# Patient Record
Sex: Female | Born: 1975 | Race: White | Hispanic: Yes | Smoking: Never smoker
Health system: Southern US, Community
[De-identification: ages and names within clinical notes are randomized; demographics above are authoritative.]

---

## 2020-03-06 ENCOUNTER — Other Ambulatory Visit: Payer: Self-pay | Admitting: Endocrinology

## 2020-03-06 DIAGNOSIS — Z1231 Encounter for screening mammogram for malignant neoplasm of breast: Secondary | ICD-10-CM

## 2022-02-11 ENCOUNTER — Other Ambulatory Visit: Payer: Self-pay | Admitting: Endocrinology

## 2022-02-11 DIAGNOSIS — Z1231 Encounter for screening mammogram for malignant neoplasm of breast: Secondary | ICD-10-CM

## 2022-02-18 ENCOUNTER — Ambulatory Visit: Payer: Medicaid Other

## 2022-02-18 ENCOUNTER — Ambulatory Visit
Admission: RE | Admit: 2022-02-18 | Discharge: 2022-02-18 | Disposition: A | Discharge status: Home / Self Care | Payer: BC Managed Care – PPO | Source: Ambulatory Visit | Attending: Endocrinology | Admitting: Endocrinology

## 2022-02-18 DIAGNOSIS — Z1231 Encounter for screening mammogram for malignant neoplasm of breast: Secondary | ICD-10-CM

## 2022-02-19 ENCOUNTER — Other Ambulatory Visit: Payer: Self-pay | Admitting: Endocrinology

## 2022-02-19 DIAGNOSIS — R928 Other abnormal and inconclusive findings on diagnostic imaging of breast: Secondary | ICD-10-CM

## 2022-03-12 ENCOUNTER — Ambulatory Visit
Admission: RE | Admit: 2022-03-12 | Discharge: 2022-03-12 | Disposition: A | Discharge status: Home / Self Care | Payer: BC Managed Care – PPO | Source: Ambulatory Visit | Attending: Endocrinology | Admitting: Endocrinology

## 2022-03-12 ENCOUNTER — Other Ambulatory Visit: Payer: Self-pay

## 2022-03-12 ENCOUNTER — Other Ambulatory Visit: Payer: Self-pay | Admitting: Endocrinology

## 2022-03-12 DIAGNOSIS — R928 Other abnormal and inconclusive findings on diagnostic imaging of breast: Secondary | ICD-10-CM

## 2022-03-12 DIAGNOSIS — N6320 Unspecified lump in the left breast, unspecified quadrant: Secondary | ICD-10-CM

## 2022-03-12 DIAGNOSIS — R921 Mammographic calcification found on diagnostic imaging of breast: Secondary | ICD-10-CM

## 2022-03-27 ENCOUNTER — Ambulatory Visit
Admission: RE | Admit: 2022-03-27 | Discharge: 2022-03-27 | Disposition: A | Discharge status: Home / Self Care | Payer: BC Managed Care – PPO | Source: Ambulatory Visit | Attending: Endocrinology | Admitting: Endocrinology

## 2022-03-27 DIAGNOSIS — R921 Mammographic calcification found on diagnostic imaging of breast: Secondary | ICD-10-CM

## 2022-03-27 DIAGNOSIS — N6320 Unspecified lump in the left breast, unspecified quadrant: Secondary | ICD-10-CM

## 2022-03-27 HISTORY — PX: BREAST BIOPSY: SHX20

## 2022-04-18 ENCOUNTER — Ambulatory Visit: Payer: Self-pay | Admitting: General Surgery

## 2022-04-18 DIAGNOSIS — D242 Benign neoplasm of left breast: Secondary | ICD-10-CM

## 2022-04-22 ENCOUNTER — Other Ambulatory Visit: Payer: Self-pay | Admitting: General Surgery

## 2022-04-22 DIAGNOSIS — D242 Benign neoplasm of left breast: Secondary | ICD-10-CM

## 2022-04-30 ENCOUNTER — Other Ambulatory Visit: Payer: Self-pay

## 2022-04-30 ENCOUNTER — Encounter (HOSPITAL_BASED_OUTPATIENT_CLINIC_OR_DEPARTMENT_OTHER): Payer: Self-pay | Admitting: General Surgery

## 2022-05-07 ENCOUNTER — Ambulatory Visit
Admission: RE | Admit: 2022-05-07 | Discharge: 2022-05-07 | Disposition: A | Discharge status: Home / Self Care | Payer: BC Managed Care – PPO | Source: Ambulatory Visit | Attending: General Surgery | Admitting: General Surgery

## 2022-05-07 DIAGNOSIS — D242 Benign neoplasm of left breast: Secondary | ICD-10-CM

## 2022-05-09 ENCOUNTER — Ambulatory Visit
Admission: RE | Admit: 2022-05-09 | Discharge: 2022-05-09 | Disposition: A | Discharge status: Home / Self Care | Payer: BC Managed Care – PPO | Source: Ambulatory Visit | Attending: General Surgery | Admitting: General Surgery

## 2022-05-09 ENCOUNTER — Encounter (HOSPITAL_BASED_OUTPATIENT_CLINIC_OR_DEPARTMENT_OTHER): Payer: Self-pay | Admitting: General Surgery

## 2022-05-09 ENCOUNTER — Ambulatory Visit (HOSPITAL_BASED_OUTPATIENT_CLINIC_OR_DEPARTMENT_OTHER)
Admission: RE | Admit: 2022-05-09 | Discharge: 2022-05-09 | Disposition: A | Discharge status: Home / Self Care | Payer: BC Managed Care – PPO | Attending: General Surgery | Admitting: General Surgery

## 2022-05-09 ENCOUNTER — Encounter (HOSPITAL_BASED_OUTPATIENT_CLINIC_OR_DEPARTMENT_OTHER)
Admission: RE | Disposition: A | Discharge status: Home / Self Care | Payer: Self-pay | Source: Home / Self Care | Attending: General Surgery

## 2022-05-09 ENCOUNTER — Ambulatory Visit (HOSPITAL_BASED_OUTPATIENT_CLINIC_OR_DEPARTMENT_OTHER): Payer: BC Managed Care – PPO | Admitting: Certified Registered"

## 2022-05-09 ENCOUNTER — Other Ambulatory Visit: Payer: Self-pay

## 2022-05-09 DIAGNOSIS — N6082 Other benign mammary dysplasias of left breast: Secondary | ICD-10-CM | POA: Diagnosis not present

## 2022-05-09 DIAGNOSIS — D242 Benign neoplasm of left breast: Secondary | ICD-10-CM

## 2022-05-09 HISTORY — PX: BREAST LUMPECTOMY WITH RADIOACTIVE SEED LOCALIZATION: SHX6424

## 2022-05-09 LAB — POCT PREGNANCY, URINE: Preg Test, Ur: NEGATIVE

## 2022-05-09 SURGERY — BREAST LUMPECTOMY WITH RADIOACTIVE SEED LOCALIZATION
Anesthesia: General | Site: Breast | Laterality: Left

## 2022-05-09 MED ORDER — CELECOXIB 200 MG PO CAPS
200.0000 mg | ORAL_CAPSULE | ORAL | Status: AC
  Administered 2022-05-09: 200 mg via ORAL

## 2022-05-09 MED ORDER — ONDANSETRON HCL 4 MG/2ML IJ SOLN
INTRAMUSCULAR | Status: AC
  Filled 2022-05-09: qty 2

## 2022-05-09 MED ORDER — LIDOCAINE HCL (CARDIAC) PF 100 MG/5ML IV SOSY
PREFILLED_SYRINGE | INTRAVENOUS | Status: DC | PRN
  Administered 2022-05-09: 100 mg via INTRAVENOUS

## 2022-05-09 MED ORDER — CEFAZOLIN SODIUM-DEXTROSE 2-4 GM/100ML-% IV SOLN
INTRAVENOUS | Status: AC
  Filled 2022-05-09: qty 100

## 2022-05-09 MED ORDER — GABAPENTIN 300 MG PO CAPS
300.0000 mg | ORAL_CAPSULE | ORAL | Status: AC
  Administered 2022-05-09: 300 mg via ORAL

## 2022-05-09 MED ORDER — ACETAMINOPHEN 500 MG PO TABS
ORAL_TABLET | ORAL | Status: AC
  Filled 2022-05-09: qty 2

## 2022-05-09 MED ORDER — ONDANSETRON HCL 4 MG/2ML IJ SOLN: 4.0000 mg | Freq: Once | INTRAMUSCULAR | Status: DC | PRN

## 2022-05-09 MED ORDER — OXYCODONE HCL 5 MG PO TABS: 5.0000 mg | ORAL_TABLET | Freq: Once | ORAL | Status: DC | PRN

## 2022-05-09 MED ORDER — KETOROLAC TROMETHAMINE 30 MG/ML IJ SOLN: 30.0000 mg | Freq: Once | INTRAMUSCULAR | Status: DC | PRN

## 2022-05-09 MED ORDER — MIDAZOLAM HCL 2 MG/2ML IJ SOLN
INTRAMUSCULAR | Status: AC
  Filled 2022-05-09: qty 2

## 2022-05-09 MED ORDER — DEXAMETHASONE SODIUM PHOSPHATE 10 MG/ML IJ SOLN
INTRAMUSCULAR | Status: AC
  Filled 2022-05-09: qty 1

## 2022-05-09 MED ORDER — CEFAZOLIN SODIUM-DEXTROSE 2-4 GM/100ML-% IV SOLN
2.0000 g | INTRAVENOUS | Status: AC
  Administered 2022-05-09: 2 g via INTRAVENOUS

## 2022-05-09 MED ORDER — OXYCODONE HCL 5 MG/5ML PO SOLN: 5.0000 mg | Freq: Once | ORAL | Status: DC | PRN

## 2022-05-09 MED ORDER — FENTANYL CITRATE (PF) 100 MCG/2ML IJ SOLN: 25.0000 ug | INTRAMUSCULAR | Status: DC | PRN

## 2022-05-09 MED ORDER — FENTANYL CITRATE (PF) 100 MCG/2ML IJ SOLN
INTRAMUSCULAR | Status: AC
Start: 2022-05-09 — End: ?
  Filled 2022-05-09: qty 2

## 2022-05-09 MED ORDER — DROPERIDOL 2.5 MG/ML IJ SOLN
INTRAMUSCULAR | Status: DC | PRN
  Administered 2022-05-09: .625 mg via INTRAVENOUS

## 2022-05-09 MED ORDER — FENTANYL CITRATE (PF) 100 MCG/2ML IJ SOLN
INTRAMUSCULAR | Status: DC | PRN
Start: 2022-05-09 — End: 2022-05-09
  Administered 2022-05-09 (×2): 25 ug via INTRAVENOUS

## 2022-05-09 MED ORDER — PROPOFOL 10 MG/ML IV BOLUS
INTRAVENOUS | Status: DC | PRN
  Administered 2022-05-09: 200 mg via INTRAVENOUS

## 2022-05-09 MED ORDER — CHLORHEXIDINE GLUCONATE CLOTH 2 % EX PADS: 6.0000 | MEDICATED_PAD | Freq: Once | CUTANEOUS | Status: DC

## 2022-05-09 MED ORDER — DEXAMETHASONE SODIUM PHOSPHATE 10 MG/ML IJ SOLN
INTRAMUSCULAR | Status: DC | PRN
  Administered 2022-05-09: 10 mg via INTRAVENOUS

## 2022-05-09 MED ORDER — BUPIVACAINE-EPINEPHRINE (PF) 0.25% -1:200000 IJ SOLN
INTRAMUSCULAR | Status: DC | PRN
  Administered 2022-05-09: 16 mL via PERINEURAL

## 2022-05-09 MED ORDER — CELECOXIB 200 MG PO CAPS
ORAL_CAPSULE | ORAL | Status: AC
  Filled 2022-05-09: qty 1

## 2022-05-09 MED ORDER — MIDAZOLAM HCL 5 MG/5ML IJ SOLN
INTRAMUSCULAR | Status: DC | PRN
  Administered 2022-05-09: 2 mg via INTRAVENOUS

## 2022-05-09 MED ORDER — PROPOFOL 10 MG/ML IV BOLUS
INTRAVENOUS | Status: AC
  Filled 2022-05-09: qty 20

## 2022-05-09 MED ORDER — ACETAMINOPHEN 500 MG PO TABS
1000.0000 mg | ORAL_TABLET | ORAL | Status: AC
  Administered 2022-05-09: 1000 mg via ORAL

## 2022-05-09 MED ORDER — OXYCODONE HCL 5 MG PO TABS: 5.0000 mg | ORAL_TABLET | Freq: Four times a day (QID) | ORAL | 0 refills | Status: AC | PRN

## 2022-05-09 MED ORDER — LIDOCAINE 2% (20 MG/ML) 5 ML SYRINGE
INTRAMUSCULAR | Status: AC
  Filled 2022-05-09: qty 5

## 2022-05-09 MED ORDER — LACTATED RINGERS IV SOLN: INTRAVENOUS | Status: DC

## 2022-05-09 MED ORDER — GABAPENTIN 300 MG PO CAPS
ORAL_CAPSULE | ORAL | Status: AC
  Filled 2022-05-09: qty 1

## 2022-05-09 MED ORDER — ONDANSETRON HCL 4 MG/2ML IJ SOLN
INTRAMUSCULAR | Status: DC | PRN
  Administered 2022-05-09: 4 mg via INTRAVENOUS

## 2022-05-09 SURGICAL SUPPLY — 45 items
APPLIER CLIP 9.375 MED OPEN (MISCELLANEOUS)
BLADE SURG 15 STRL LF DISP TIS (BLADE) ×1 IMPLANT
BLADE SURG 15 STRL SS (BLADE) ×1
CANISTER SUC SOCK COL 7IN (MISCELLANEOUS) ×2 IMPLANT
CANISTER SUCT 1200ML W/VALVE (MISCELLANEOUS) ×2 IMPLANT
CHLORAPREP W/TINT 26 (MISCELLANEOUS) ×2 IMPLANT
CLIP APPLIE 9.375 MED OPEN (MISCELLANEOUS) IMPLANT
COVER BACK TABLE 60X90IN (DRAPES) ×2 IMPLANT
COVER MAYO STAND STRL (DRAPES) ×2 IMPLANT
COVER PROBE W GEL 5X96 (DRAPES) ×2 IMPLANT
DERMABOND ADVANCED (GAUZE/BANDAGES/DRESSINGS) ×1
DERMABOND ADVANCED .7 DNX12 (GAUZE/BANDAGES/DRESSINGS) ×1 IMPLANT
DRAPE LAPAROSCOPIC ABDOMINAL (DRAPES) ×2 IMPLANT
DRAPE UTILITY XL STRL (DRAPES) ×2 IMPLANT
ELECT COATED BLADE 2.86 ST (ELECTRODE) ×2 IMPLANT
ELECT REM PT RETURN 9FT ADLT (ELECTROSURGICAL) ×2
ELECTRODE REM PT RTRN 9FT ADLT (ELECTROSURGICAL) ×1 IMPLANT
GLOVE BIO SURGEON STRL SZ7.5 (GLOVE) ×5 IMPLANT
GLOVE BIOGEL PI IND STRL 7.5 (GLOVE) IMPLANT
GLOVE BIOGEL PI IND STRL 8 (GLOVE) IMPLANT
GLOVE BIOGEL PI INDICATOR 7.5 (GLOVE) ×1
GLOVE BIOGEL PI INDICATOR 8 (GLOVE) ×1
GLOVE ECLIPSE 6.5 STRL STRAW (GLOVE) ×1 IMPLANT
GOWN STRL REUS W/ TWL LRG LVL3 (GOWN DISPOSABLE) ×2 IMPLANT
GOWN STRL REUS W/TWL LRG LVL3 (GOWN DISPOSABLE) ×2
ILLUMINATOR WAVEGUIDE N/F (MISCELLANEOUS) IMPLANT
KIT MARKER MARGIN INK (KITS) ×2 IMPLANT
LIGHT WAVEGUIDE WIDE FLAT (MISCELLANEOUS) IMPLANT
NDL HYPO 25X1 1.5 SAFETY (NEEDLE) IMPLANT
NEEDLE HYPO 25X1 1.5 SAFETY (NEEDLE) IMPLANT
NS IRRIG 1000ML POUR BTL (IV SOLUTION) IMPLANT
PACK BASIN DAY SURGERY FS (CUSTOM PROCEDURE TRAY) ×2 IMPLANT
PENCIL SMOKE EVACUATOR (MISCELLANEOUS) ×2 IMPLANT
SLEEVE SCD COMPRESS KNEE MED (STOCKING) ×2 IMPLANT
SLEEVE SURGEON STRL (DRAPES) ×1 IMPLANT
SPIKE FLUID TRANSFER (MISCELLANEOUS) IMPLANT
SPONGE T-LAP 18X18 ~~LOC~~+RFID (SPONGE) ×2 IMPLANT
SUT MON AB 4-0 PC3 18 (SUTURE) ×3 IMPLANT
SUT SILK 2 0 SH (SUTURE) IMPLANT
SUT VICRYL 3-0 CR8 SH (SUTURE) ×2 IMPLANT
SYR CONTROL 10ML LL (SYRINGE) IMPLANT
TOWEL GREEN STERILE FF (TOWEL DISPOSABLE) ×2 IMPLANT
TRAY FAXITRON CT DISP (TRAY / TRAY PROCEDURE) ×2 IMPLANT
TUBE CONNECTING 20X1/4 (TUBING) ×2 IMPLANT
YANKAUER SUCT BULB TIP NO VENT (SUCTIONS) IMPLANT

## 2022-05-09 NOTE — H&P (Signed)
REFERRING PHYSICIAN: Beverley Fiedler, *  PROVIDER: Landry Corporal, MD  MRN: [id number] DOB: [birth year] Subjective   Chief Complaint: New Patient (New patient left breast papilloma )   History of Present Illness: Jill Hutchinson is a 46 y.o. female who is seen today as an office consultation for evaluation of New Patient (New patient left breast papilloma ) .   We are asked to see the patient in consultation by Dr. Irwin Brakeman to evaluate her for an intraductal papilloma of the left breast. The patient is a 46 year old white female who recently went for a routine screening mammogram. At that time she was found to have a 4 mm cluster of calcifications in the upper outer left breast as well as a 1.3 cm mass in the upper outer quadrant of the left breast. Both were biopsied. The calcifications came back benign. The mass came back as an intraductal papilloma. She denies any family history of breast cancer. She is otherwise in good health and does not smoke.  Review of Systems: A complete review of systems was obtained from the patient. I have reviewed this information and discussed as appropriate with the patient. See HPI as well for other ROS.  ROS   Medical History: History reviewed. No pertinent past medical history.  Patient Active Problem List  Diagnosis   Intraductal papilloma of breast, left   History reviewed. No pertinent surgical history.   Not on File  No current outpatient medications on file prior to visit.   No current facility-administered medications on file prior to visit.   History reviewed. No pertinent family history.   Social History   Tobacco Use  Smoking Status Not on file  Smokeless Tobacco Not on file    Social History   Socioeconomic History   Marital status: Single   Objective:  There were no vitals filed for this visit.  There is no height or weight on file to calculate BMI.  Physical Exam Vitals reviewed.  Constitutional:   General: She is not in acute distress. Appearance: Normal appearance.  HENT:  Head: Normocephalic and atraumatic.  Right Ear: External ear normal.  Left Ear: External ear normal.  Nose: Nose normal.  Mouth/Throat:  Mouth: Mucous membranes are moist.  Pharynx: Oropharynx is clear.  Eyes:  General: No scleral icterus. Extraocular Movements: Extraocular movements intact.  Conjunctiva/sclera: Conjunctivae normal.  Pupils: Pupils are equal, round, and reactive to light.  Cardiovascular:  Rate and Rhythm: Normal rate and regular rhythm.  Pulses: Normal pulses.  Heart sounds: Normal heart sounds.  Pulmonary:  Effort: Pulmonary effort is normal. No respiratory distress.  Breath sounds: Normal breath sounds.  Abdominal:  General: Bowel sounds are normal.  Palpations: Abdomen is soft.  Tenderness: There is no abdominal tenderness.  Musculoskeletal:  General: No swelling, tenderness or deformity. Normal range of motion.  Cervical back: Normal range of motion and neck supple.  Skin: General: Skin is warm and dry.  Coloration: Skin is not jaundiced.  Neurological:  General: No focal deficit present.  Mental Status: She is alert and oriented to person, place, and time.  Psychiatric:  Mood and Affect: Mood normal.  Behavior: Behavior normal.     Breast: There is no palpable mass in either breast. There is no palpable axillary, supraclavicular, or cervical lymphadenopathy.  Labs, Imaging and Diagnostic Testing:  Assessment and Plan:   Diagnoses and all orders for this visit:  Intraductal papilloma of breast, left    The patient appears to have a 1.3  cm papilloma in the upper outer quadrant of the left breast. Because of its size there is a 5 to 10% chance of missing something more significant and for this reason the recommendation is to have this area removed. I have discussed with her in detail the risks and benefits of the operation as well as some of the technical aspects  including the use of a radioactive seed for localization and she understands and wishes to proceed.

## 2022-05-09 NOTE — Transfer of Care (Signed)
Immediate Anesthesia Transfer of Care Note  Patient: Jill Hutchinson  Procedure(s) Performed: LEFT BREAST LUMPECTOMY WITH RADIOACTIVE SEED LOCALIZATION (Left: Breast)  Patient Location: PACU  Anesthesia Type:General  Level of Consciousness: drowsy  Airway & Oxygen Therapy: Patient Spontanous Breathing and Patient connected to face mask oxygen  Post-op Assessment: Report given to RN and Post -op Vital signs reviewed and stable  Post vital signs: Reviewed and stable  Last Vitals:  Vitals Value Taken Time  BP 112/65 05/09/22 1014  Temp 36.6 C 05/09/22 1014  Pulse 76 05/09/22 1014  Resp 18 05/09/22 1014  SpO2 100 % 05/09/22 1014  Vitals shown include unvalidated device data.  Last Pain:  Vitals:   05/09/22 0854  TempSrc: Oral  PainSc: 0-No pain         Complications: No notable events documented.

## 2022-05-09 NOTE — Anesthesia Postprocedure Evaluation (Signed)
Anesthesia Post Note  Patient: Jill Hutchinson  Procedure(s) Performed: LEFT BREAST LUMPECTOMY WITH RADIOACTIVE SEED LOCALIZATION (Left: Breast)     Patient location during evaluation: PACU Anesthesia Type: General Level of consciousness: awake and alert Pain management: pain level controlled Vital Signs Assessment: post-procedure vital signs reviewed and stable Respiratory status: spontaneous breathing, nonlabored ventilation, respiratory function stable and patient connected to nasal cannula oxygen Cardiovascular status: blood pressure returned to baseline and stable Postop Assessment: no apparent nausea or vomiting Anesthetic complications: no   No notable events documented.  Last Vitals:  Vitals:   05/09/22 1015 05/09/22 1030  BP: (!) 96/58 98/68  Pulse: 69 60  Resp: 18 15  Temp:    SpO2: 100% 99%    Last Pain:  Vitals:   05/09/22 1014  TempSrc:   PainSc: Asleep                 Maahi Lannan S

## 2022-05-09 NOTE — Op Note (Addendum)
05/09/2022  10:08 AM  PATIENT:  Jill Hutchinson  46 y.o. female  PRE-OPERATIVE DIAGNOSIS:  LEFT BREAST PAPILLOMA  POST-OPERATIVE DIAGNOSIS:  LEFT BREAST PAPILLOMA  PROCEDURE:  Procedure(s): LEFT BREAST LUMPECTOMY WITH RADIOACTIVE SEED LOCALIZATION (Left)  SURGEON:  Surgeon(s) and Role:    Jovita Kussmaul, MD - Primary  PHYSICIAN ASSISTANT:   ASSISTANTS: Dr. Cathey Endow   ANESTHESIA:   local and general  EBL:  10 mL   BLOOD ADMINISTERED:none  DRAINS: none   LOCAL MEDICATIONS USED:  MARCAINE     SPECIMEN:  Source of Specimen:  left breast tissue  DISPOSITION OF SPECIMEN:  PATHOLOGY  COUNTS:  YES  TOURNIQUET:  * No tourniquets in log *  DICTATION: .Dragon Dictation  After informed consent was obtained the patient was brought to the operating room and placed in the supine position on the operating table.  After adequate induction of general anesthesia the patient's left breast was prepped with ChloraPrep, allowed to dry, and draped in usual sterile manner.  An appropriate timeout was performed.  Previously an I-125 seed was placed in the outer aspect of the left breast to mark an area of intraductal papilloma.  The neoprobe was set to I-125 in the area of radioactivity was readily identified.  The area around this was infiltrated with quarter percent Marcaine.  A curvilinear incision was then made along the outer edge of the areola with a 15 blade knife.  The incision was carried through the skin and subcutaneous tissue sharply with the electrocautery.  Dissection was then carried towards the radioactive seed under the direction of the neoprobe.  Once we more closely approached the radioactive seed we then removed a circular portion of breast tissue sharply with the electrocautery around the radioactive seed while checking the area of radioactivity frequently.  Once the specimen was removed it was oriented with the appropriate paint colors.  A specimen radiograph was  obtained that showed the clip and seed to be within the specimen.  The specimen was then sent to pathology for further evaluation.  Hemostasis was achieved using the Bovie electrocautery.  Of note there was an area of firmness at the site of the radioactive seed.  The breast tissue around this was all soft and fatty in nature.  The deep layer of the incision was then closed with layers of interrupted 3-0 Vicryl stitches.  The skin was then closed with interrupted 4-0 Monocryl subcuticular stitches.  Dermabond dressings were applied.  The patient tolerated the procedure well.  At the end of the case all needle sponge and instrument counts were correct.  The patient was then awakened and taken to recovery in stable condition.I was personally present during the key and critical portions of this procedure and immediately available throughout the entire procedure, as documented in my operative note.   PLAN OF CARE: Discharge to home after PACU  PATIENT DISPOSITION:  PACU - hemodynamically stable.   Delay start of Pharmacological VTE agent (>24hrs) due to surgical blood loss or risk of bleeding: not applicable

## 2022-05-09 NOTE — Interval H&P Note (Signed)
History and Physical Interval Note:  05/09/2022 8:58 AM  Jill Hutchinson  has presented today for surgery, with the diagnosis of LEFT BREAST PAPILLOMA.  The various methods of treatment have been discussed with the patient and family. After consideration of risks, benefits and other options for treatment, the patient has consented to  Procedure(s): LEFT BREAST LUMPECTOMY WITH RADIOACTIVE SEED LOCALIZATION (Left) as a surgical intervention.  The patient's history has been reviewed, patient examined, no change in status, stable for surgery.  I have reviewed the patient's chart and labs.  Questions were answered to the patient's satisfaction.     Autumn Messing III

## 2022-05-09 NOTE — Anesthesia Preprocedure Evaluation (Signed)
Anesthesia Evaluation  Patient identified by MRN, date of birth, ID band Patient awake    Reviewed: Allergy & Precautions, NPO status , Patient's Chart, lab work & pertinent test results  Airway Mallampati: II  TM Distance: >3 FB Neck ROM: Full    Dental no notable dental hx.    Pulmonary neg pulmonary ROS,    Pulmonary exam normal breath sounds clear to auscultation       Cardiovascular negative cardio ROS Normal cardiovascular exam Rhythm:Regular Rate:Normal     Neuro/Psych negative neurological ROS  negative psych ROS   GI/Hepatic negative GI ROS, Neg liver ROS,   Endo/Other  negative endocrine ROS  Renal/GU negative Renal ROS  negative genitourinary   Musculoskeletal negative musculoskeletal ROS (+)   Abdominal   Peds negative pediatric ROS (+)  Hematology negative hematology ROS (+)   Anesthesia Other Findings   Reproductive/Obstetrics negative OB ROS                             Anesthesia Physical Anesthesia Plan  ASA: 1  Anesthesia Plan: General   Post-op Pain Management: Minimal or no pain anticipated   Induction: Intravenous  PONV Risk Score and Plan: 3 and Ondansetron, Dexamethasone, Midazolam and Treatment may vary due to age or medical condition  Airway Management Planned: LMA  Additional Equipment:   Intra-op Plan:   Post-operative Plan: Extubation in OR  Informed Consent: I have reviewed the patients History and Physical, chart, labs and discussed the procedure including the risks, benefits and alternatives for the proposed anesthesia with the patient or authorized representative who has indicated his/her understanding and acceptance.     Dental advisory given  Plan Discussed with: CRNA and Surgeon  Anesthesia Plan Comments:         Anesthesia Quick Evaluation

## 2022-05-09 NOTE — Discharge Instructions (Addendum)
  Post Anesthesia Home Care Instructions  Activity: Get plenty of rest for the remainder of the day. A responsible individual must stay with you for 24 hours following the procedure.  For the next 24 hours, DO NOT: -Drive a car -Paediatric nurse -Drink alcoholic beverages -Take any medication unless instructed by your physician -Make any legal decisions or sign important papers.  Meals: Start with liquid foods such as gelatin or soup. Progress to regular foods as tolerated. Avoid greasy, spicy, heavy foods. If nausea and/or vomiting occur, drink only clear liquids until the nausea and/or vomiting subsides. Call your physician if vomiting continues.  Special Instructions/Symptoms: Your throat may feel dry or sore from the anesthesia or the breathing tube placed in your throat during surgery. If this causes discomfort, gargle with warm salt water. The discomfort should disappear within 24 hours.  If you had a scopolamine patch placed behind your ear for the management of post- operative nausea and/or vomiting:  1. The medication in the patch is effective for 72 hours, after which it should be removed.  Wrap patch in a tissue and discard in the trash. Wash hands thoroughly with soap and water. 2. You may remove the patch earlier than 72 hours if you experience unpleasant side effects which may include dry mouth, dizziness or visual disturbances. 3. Avoid touching the patch. Wash your hands with soap and water after contact with the patch.      Next dose of Tylenol can be given at 3:00pm if needed. Next dose of NSAID (Ibuprofen/Motrin/Aleve) can be given at 5:00pm if needed.

## 2022-05-09 NOTE — Anesthesia Procedure Notes (Signed)
Procedure Name: LMA Insertion Date/Time: 05/09/2022 9:19 AM Performed by: Lavonia Dana, CRNA Pre-anesthesia Checklist: Patient identified, Emergency Drugs available, Suction available and Patient being monitored Patient Re-evaluated:Patient Re-evaluated prior to induction Oxygen Delivery Method: Circle system utilized Preoxygenation: Pre-oxygenation with 100% oxygen Induction Type: IV induction Ventilation: Mask ventilation without difficulty LMA: LMA inserted LMA Size: 4.0 Number of attempts: 1 Airway Equipment and Method: Bite block Placement Confirmation: positive ETCO2 Tube secured with: Tape Dental Injury: Teeth and Oropharynx as per pre-operative assessment

## 2022-05-13 ENCOUNTER — Encounter (HOSPITAL_BASED_OUTPATIENT_CLINIC_OR_DEPARTMENT_OTHER): Payer: Self-pay | Admitting: General Surgery

## 2022-05-13 LAB — SURGICAL PATHOLOGY

## 2022-06-13 ENCOUNTER — Encounter (HOSPITAL_COMMUNITY): Payer: Self-pay

## 2023-07-31 IMAGING — MG MM PLC BREAST LOC DEV 1ST LESION INC MAMMO GUIDE*L*
8 of 9 series · 8 of 9 positions shown · non-contrast
Comparison: None Available.

CLINICAL DATA: Pre surgical excision localization of a recently
biopsied intraductal papilloma in the 2 o'clock position of the left
breast, marked with a ribbon shaped biopsy marker clip.

EXAM:
MAMMOGRAPHIC GUIDED RADIOACTIVE SEED LOCALIZATION OF THE LEFT BREAST

[L LM (1 of 4)]
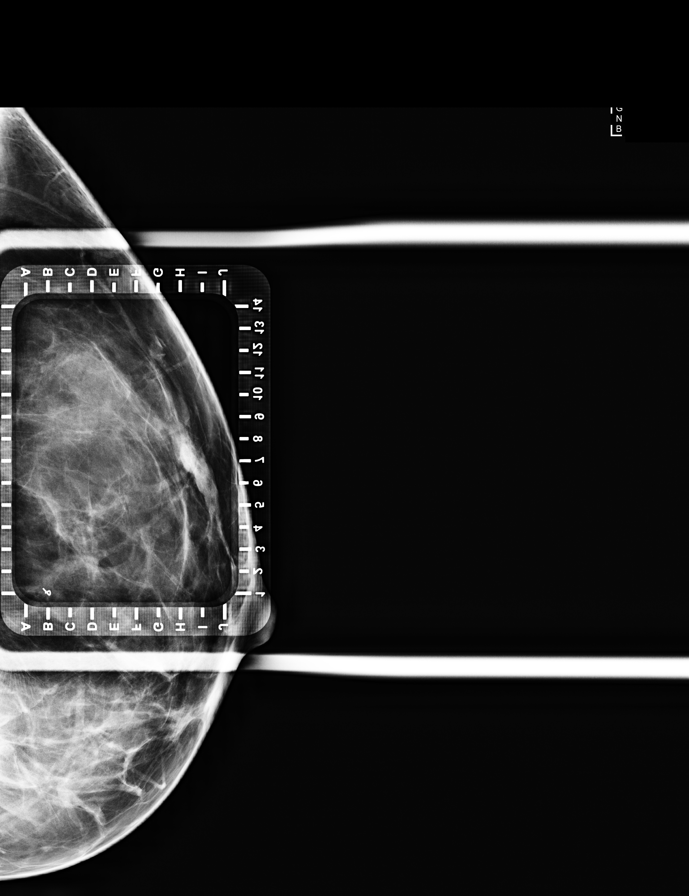

[L CC (1 of 3)]
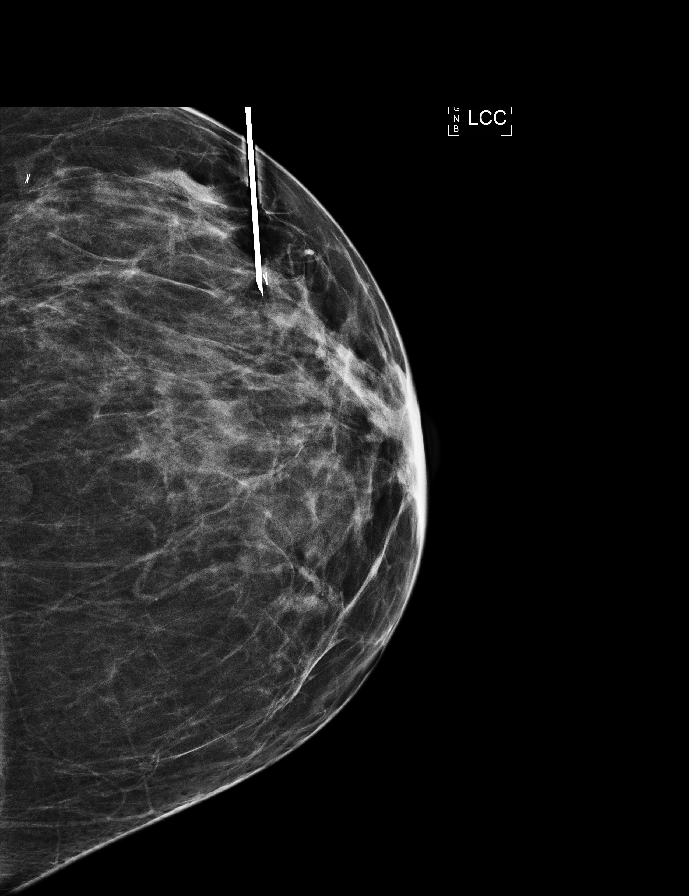

[L LM (2 of 4)]
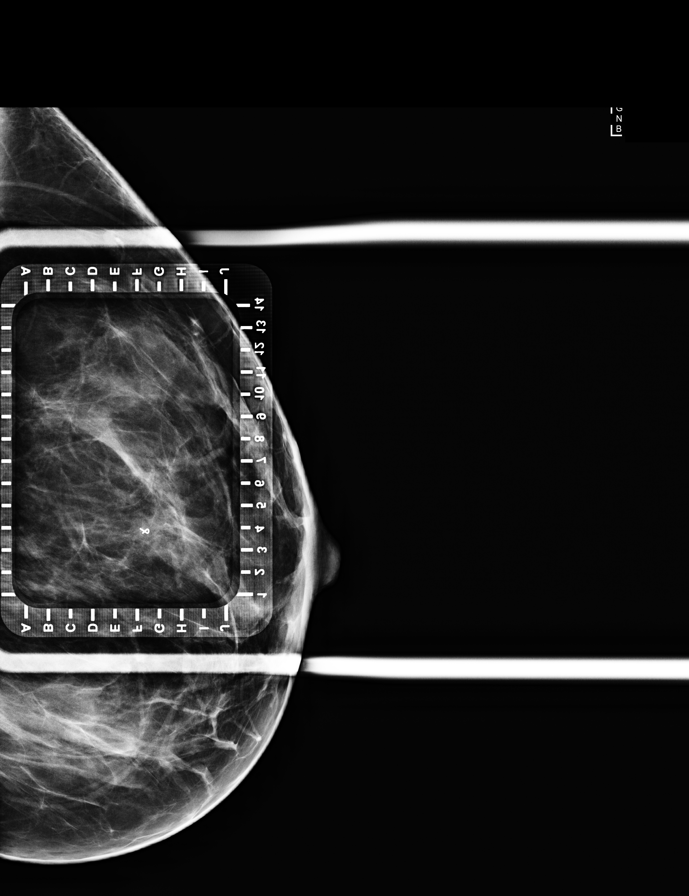

[L CC (2 of 3)]
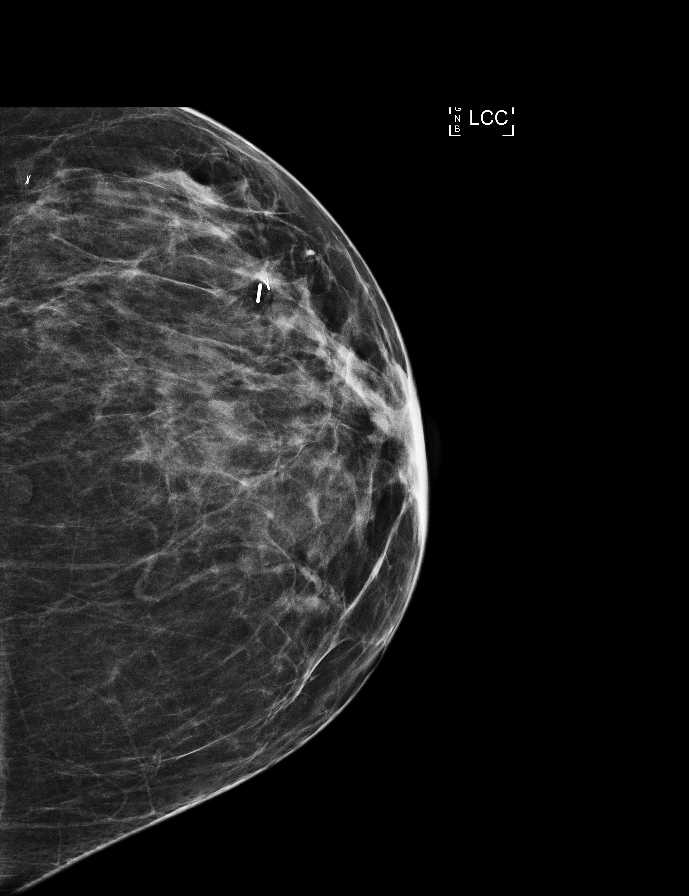

[L LM (3 of 4)]
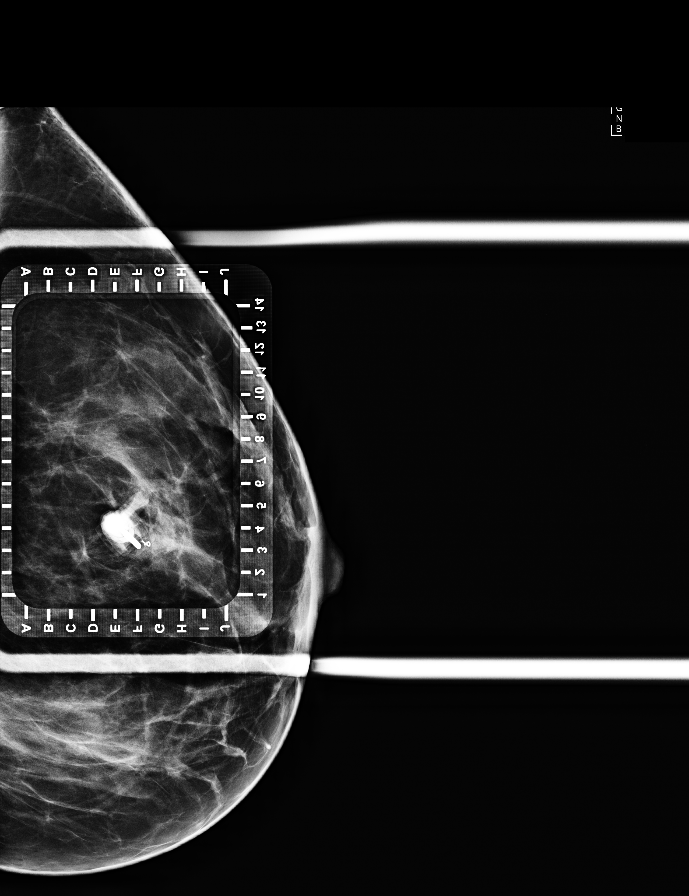

[L LM (4 of 4)]
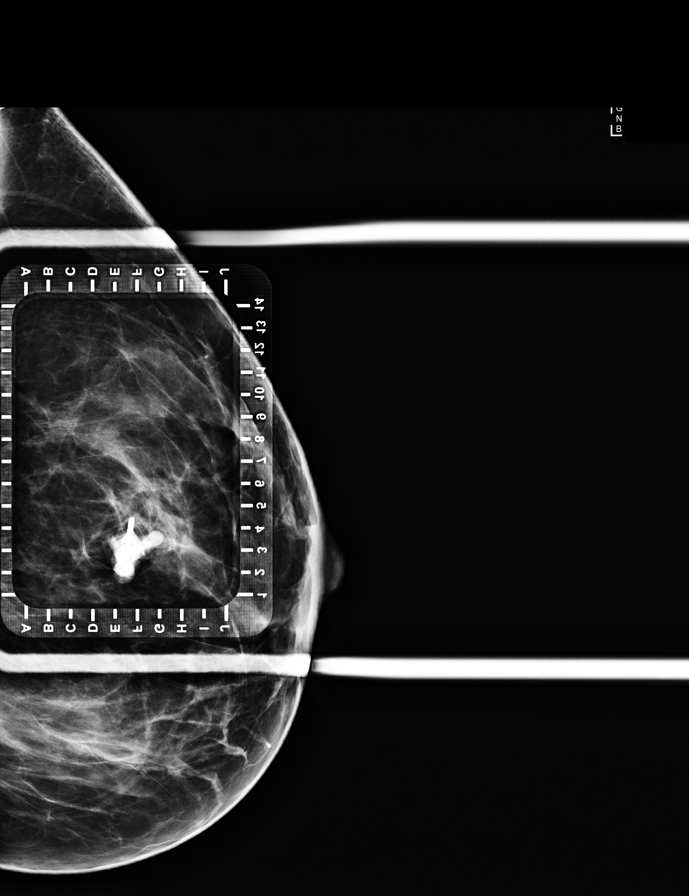

[L ML]
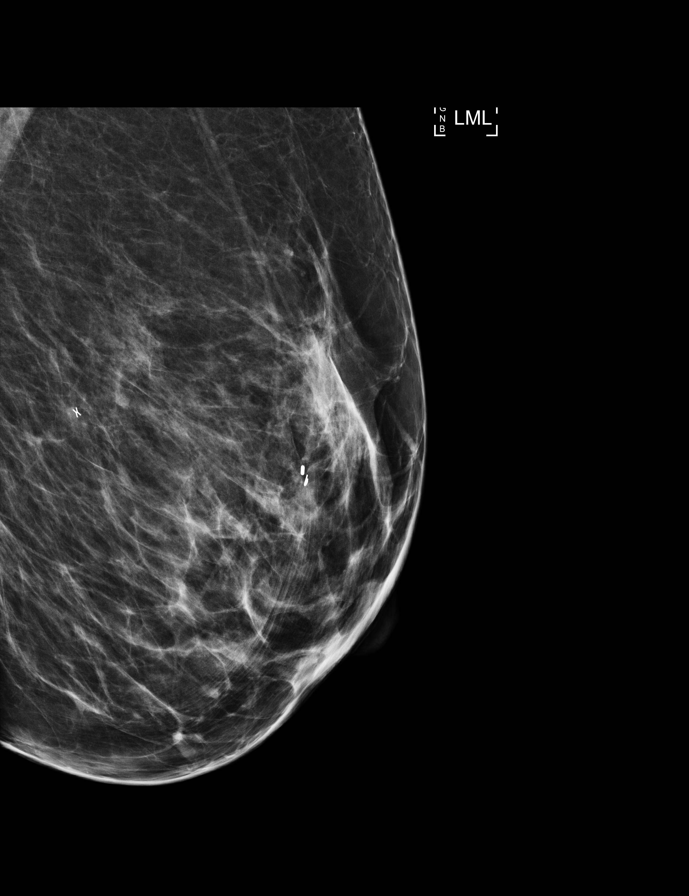

[L CC (3 of 3)]
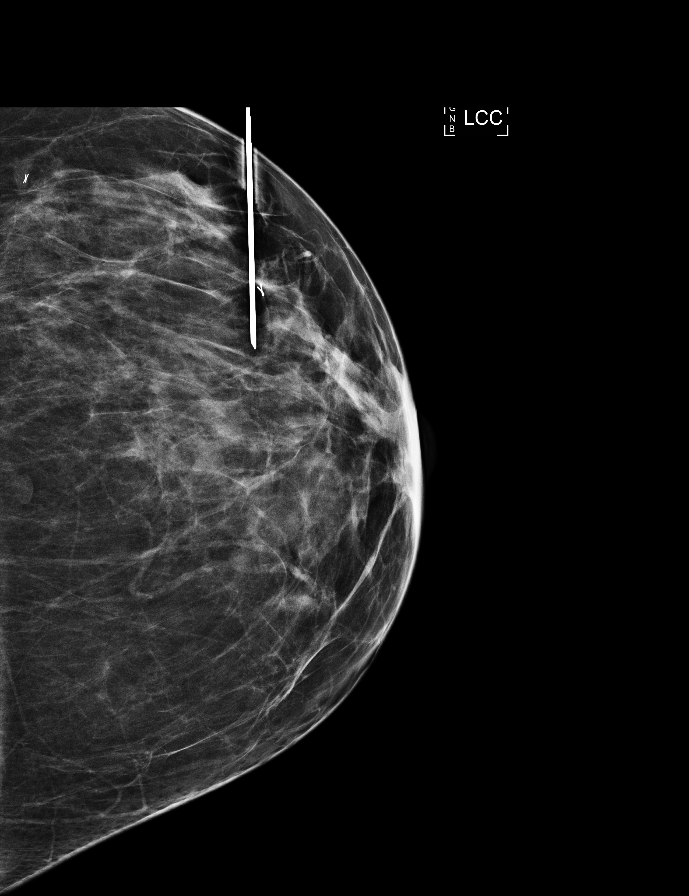

[8 of 9 positions shown; findings below may reference images not displayed]

FINDINGS: Patient presents for radioactive seed localization prior to left
breast excision. I met with the patient and we discussed the
procedure of seed localization including benefits and alternatives.
We discussed the high likelihood of a successful procedure. We
discussed the risks of the procedure including infection, bleeding,
tissue injury and further surgery. We discussed the low dose of
radioactivity involved in the procedure. Informed, written consent
was given.

The usual time-out protocol was performed immediately prior to the
procedure.

Using mammographic guidance, sterile technique, 1% lidocaine and an
F-RS5 radioactive seed, the ribbon shaped biopsy marker clip in the
anterior upper outer left breast was localized using a lateral
approach. The follow-up mammogram images confirm the seed in the
expected location and were marked for Dr. Chowdhury Mina.

Follow-up survey of the patient confirms presence of the radioactive
seed.

Order number of F-RS5 seed:  [id number].

Total activity:  0.256 mCi reference Date: 04/06/2022

The patient tolerated the procedure well and was released from the
[REDACTED]. She was given instructions regarding seed removal.
IMPRESSION: Radioactive seed localization left breast. No apparent
complications.

## 2023-08-02 IMAGING — MG MM BREAST SURGICAL SPECIMEN
1 series · 2 of 2 positions shown · non-contrast
Comparison: Previous exams

CLINICAL DATA: Status post surgical excision today after earlier
radioactive seed localization.

EXAM:
SPECIMEN RADIOGRAPH OF THE LEFT BREAST

[Series 1: L · left · 0.07mm/px · 2 of 2 slices shown]
[im 1/2]
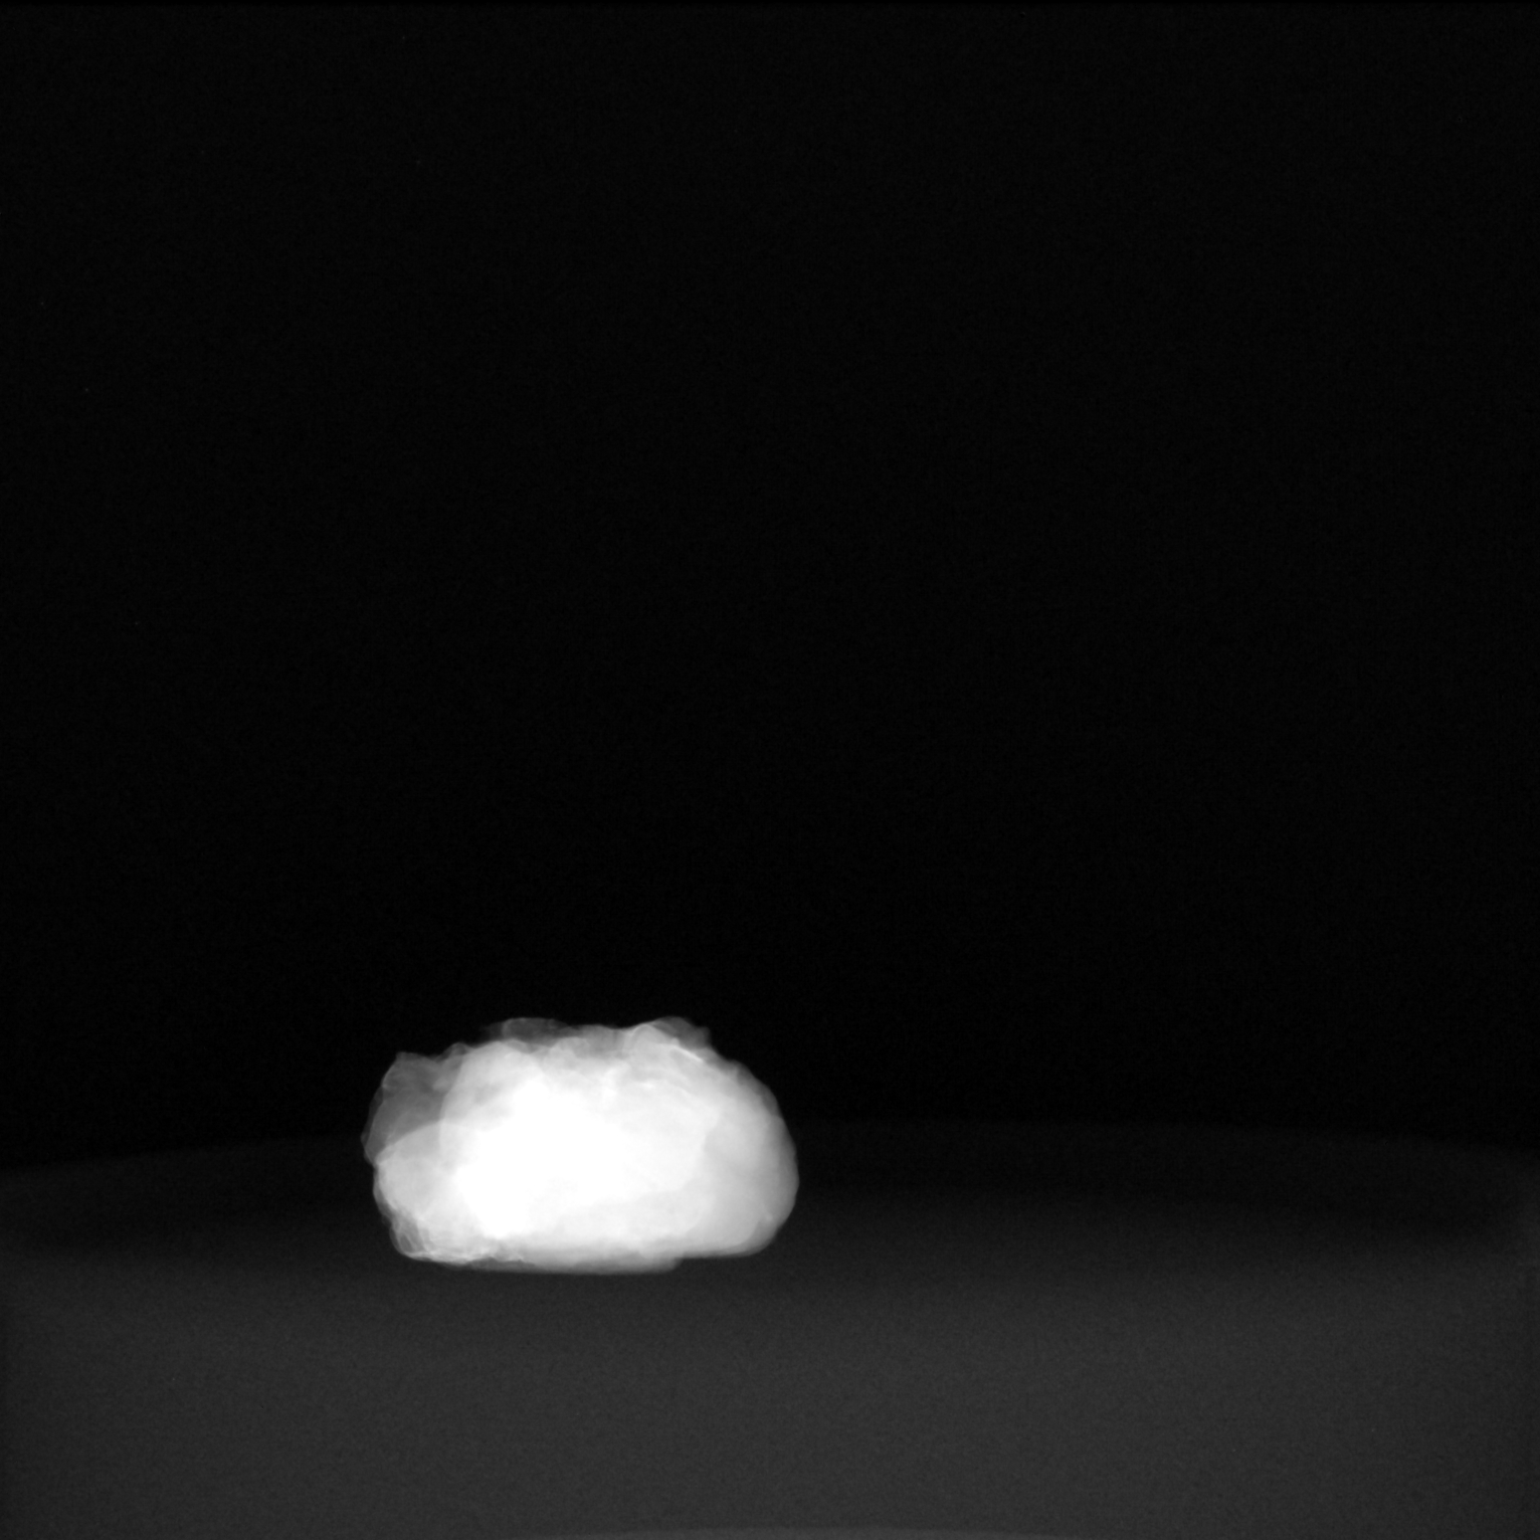
[im 2/2]
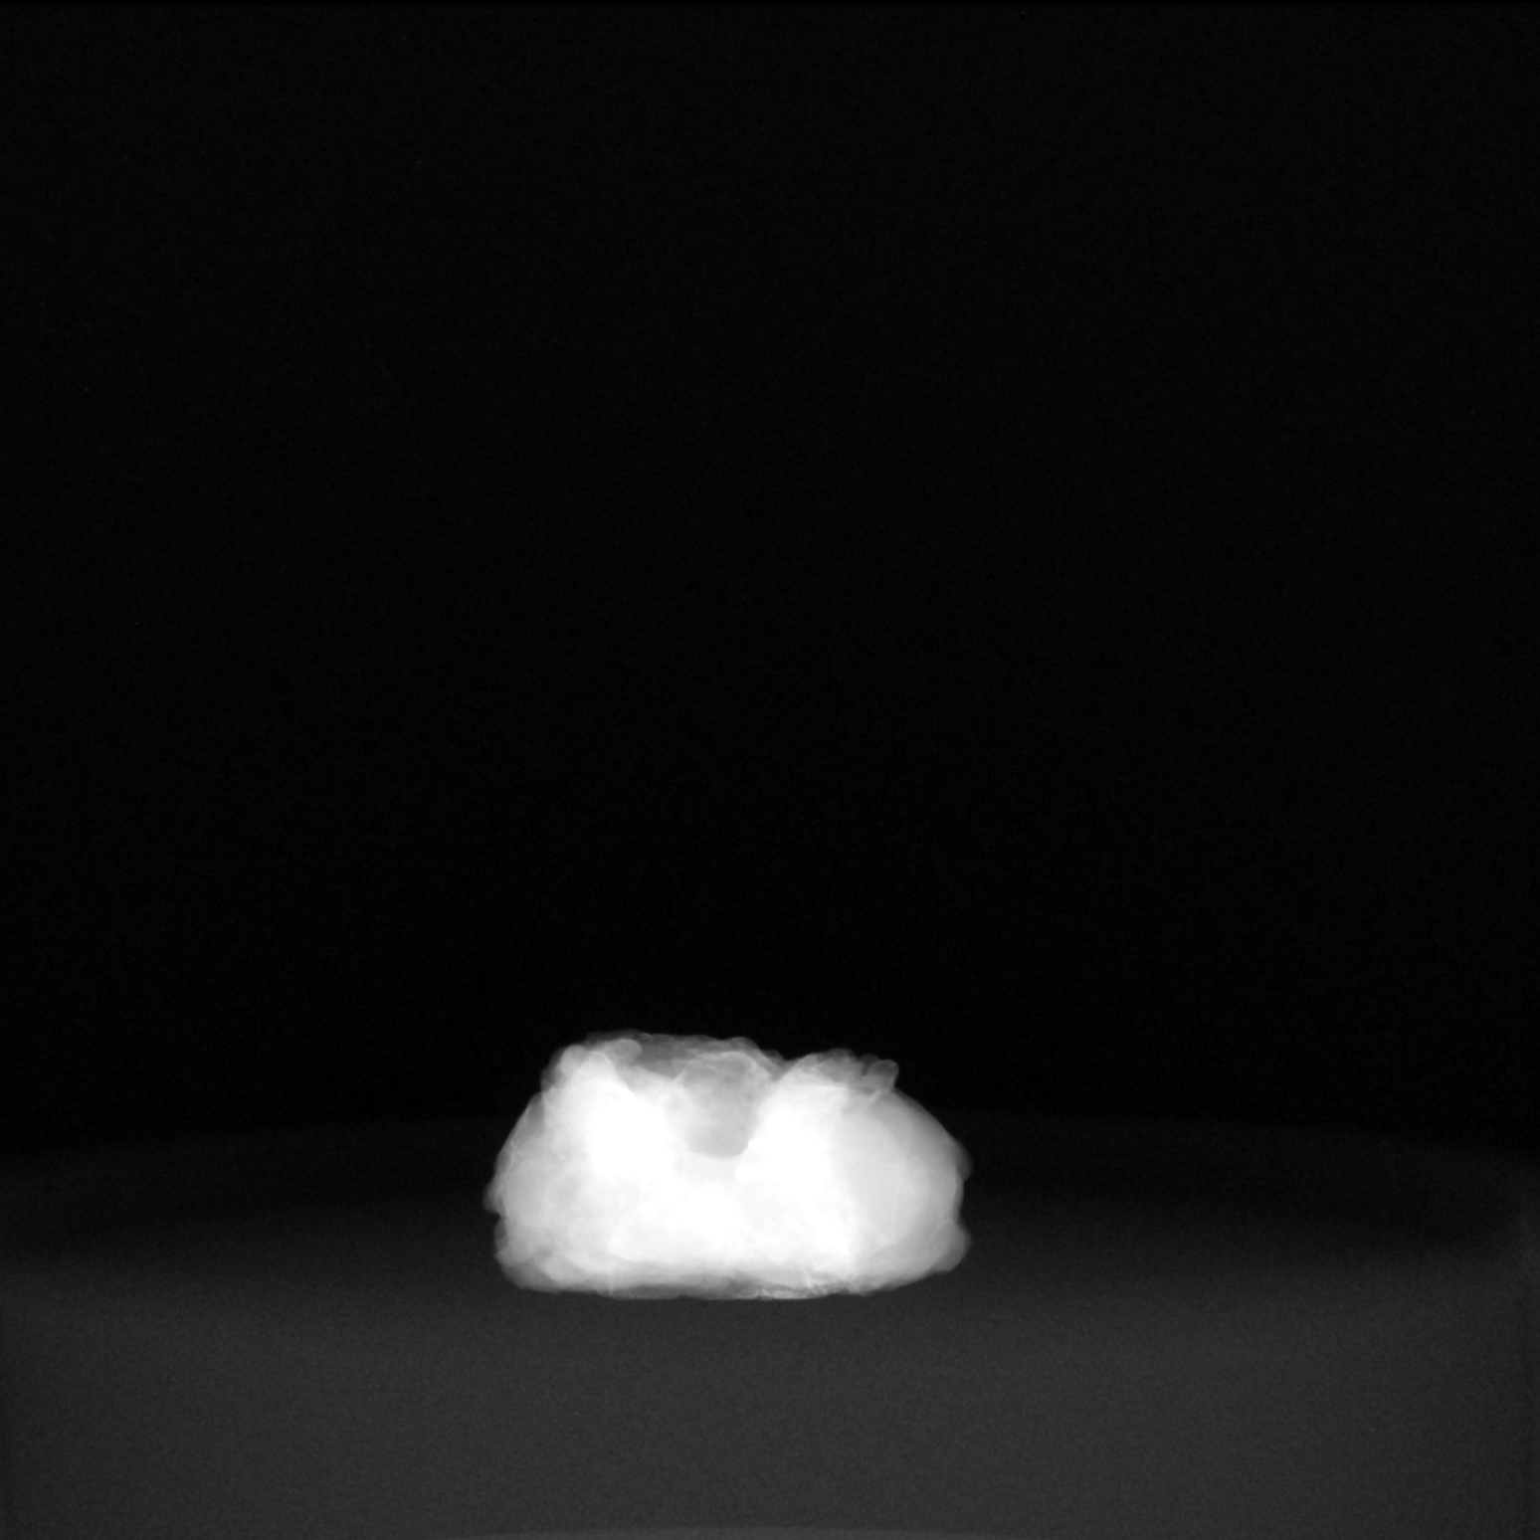

[2 of 2 positions shown; findings below may reference images not displayed]

FINDINGS: Status post excision of the left breast. The radioactive seed and
biopsy marker clip are present and appear completely intact within
the specimen. Findings discussed with the OR staff during the
procedure.
IMPRESSION: Specimen radiograph of the left breast.

## 2023-12-22 ENCOUNTER — Encounter (HOSPITAL_BASED_OUTPATIENT_CLINIC_OR_DEPARTMENT_OTHER): Payer: Self-pay | Admitting: Emergency Medicine

## 2023-12-22 ENCOUNTER — Emergency Department (HOSPITAL_COMMUNITY): Payer: Medicaid Other | Admitting: Anesthesiology

## 2023-12-22 ENCOUNTER — Other Ambulatory Visit: Payer: Self-pay

## 2023-12-22 ENCOUNTER — Emergency Department (HOSPITAL_BASED_OUTPATIENT_CLINIC_OR_DEPARTMENT_OTHER): Payer: Medicaid Other

## 2023-12-22 ENCOUNTER — Ambulatory Visit (HOSPITAL_BASED_OUTPATIENT_CLINIC_OR_DEPARTMENT_OTHER)
Admission: EM | Admit: 2023-12-22 | Discharge: 2023-12-22 | Disposition: A | Discharge status: Home / Self Care | Payer: Medicaid Other | Attending: Emergency Medicine | Admitting: Emergency Medicine

## 2023-12-22 ENCOUNTER — Encounter (HOSPITAL_COMMUNITY)
Admission: EM | Disposition: A | Discharge status: Home / Self Care | Payer: Self-pay | Source: Home / Self Care | Attending: Emergency Medicine

## 2023-12-22 DIAGNOSIS — K3533 Acute appendicitis with perforation and localized peritonitis, with abscess: Secondary | ICD-10-CM | POA: Diagnosis present

## 2023-12-22 DIAGNOSIS — K353 Acute appendicitis with localized peritonitis, without perforation or gangrene: Secondary | ICD-10-CM

## 2023-12-22 DIAGNOSIS — R9389 Abnormal findings on diagnostic imaging of other specified body structures: Secondary | ICD-10-CM

## 2023-12-22 HISTORY — PX: LAPAROSCOPIC APPENDECTOMY: SHX408

## 2023-12-22 LAB — CBC WITH DIFFERENTIAL/PLATELET
Abs Immature Granulocytes: 0.05 10*3/uL (ref 0.00–0.07)
Basophils Absolute: 0 10*3/uL (ref 0.0–0.1)
Basophils Relative: 0 %
Eosinophils Absolute: 0.1 10*3/uL (ref 0.0–0.5)
Eosinophils Relative: 1 %
HCT: 35.6 % — ABNORMAL LOW (ref 36.0–46.0)
Hemoglobin: 11.7 g/dL — ABNORMAL LOW (ref 12.0–15.0)
Immature Granulocytes: 0 %
Lymphocytes Relative: 10 %
Lymphs Abs: 1.6 10*3/uL (ref 0.7–4.0)
MCH: 25.8 pg — ABNORMAL LOW (ref 26.0–34.0)
MCHC: 32.9 g/dL (ref 30.0–36.0)
MCV: 78.6 fL — ABNORMAL LOW (ref 80.0–100.0)
Monocytes Absolute: 1.3 10*3/uL — ABNORMAL HIGH (ref 0.1–1.0)
Monocytes Relative: 9 %
Neutro Abs: 12 10*3/uL — ABNORMAL HIGH (ref 1.7–7.7)
Neutrophils Relative %: 80 %
Platelets: 349 10*3/uL (ref 150–400)
RBC: 4.53 MIL/uL (ref 3.87–5.11)
RDW: 14.9 % (ref 11.5–15.5)
WBC: 15.1 10*3/uL — ABNORMAL HIGH (ref 4.0–10.5)
nRBC: 0 % (ref 0.0–0.2)

## 2023-12-22 LAB — COMPREHENSIVE METABOLIC PANEL
ALT: 15 U/L (ref 0–44)
AST: 14 U/L — ABNORMAL LOW (ref 15–41)
Albumin: 4.2 g/dL (ref 3.5–5.0)
Alkaline Phosphatase: 48 U/L (ref 38–126)
Anion gap: 10 (ref 5–15)
BUN: 11 mg/dL (ref 6–20)
CO2: 25 mmol/L (ref 22–32)
Calcium: 9.3 mg/dL (ref 8.9–10.3)
Chloride: 102 mmol/L (ref 98–111)
Creatinine, Ser: 0.58 mg/dL (ref 0.44–1.00)
GFR, Estimated: >60 mL/min (ref >60)
Glucose, Bld: 106 mg/dL — ABNORMAL HIGH (ref 70–99)
Potassium: 3.7 mmol/L (ref 3.5–5.1)
Sodium: 137 mmol/L (ref 135–145)
Total Bilirubin: 1.2 mg/dL (ref 0.0–1.2)
Total Protein: 6.7 g/dL (ref 6.5–8.1)

## 2023-12-22 LAB — URINALYSIS, ROUTINE W REFLEX MICROSCOPIC
Bacteria, UA: NONE SEEN
Bilirubin Urine: NEGATIVE
Glucose, UA: NEGATIVE mg/dL
Ketones, ur: NEGATIVE mg/dL
Leukocytes,Ua: NEGATIVE
Nitrite: NEGATIVE
Protein, ur: NEGATIVE mg/dL
Specific Gravity, Urine: 1.009 (ref 1.005–1.030)
pH: 6 (ref 5.0–8.0)

## 2023-12-22 LAB — LIPASE, BLOOD: Lipase: 13 U/L (ref 11–51)

## 2023-12-22 LAB — POCT PREGNANCY, URINE: Preg Test, Ur: NEGATIVE

## 2023-12-22 SURGERY — APPENDECTOMY, LAPAROSCOPIC
Anesthesia: General

## 2023-12-22 MED ORDER — ACETAMINOPHEN 500 MG PO TABS: 1000.0000 mg | ORAL_TABLET | ORAL | Status: DC

## 2023-12-22 MED ORDER — FENTANYL CITRATE (PF) 250 MCG/5ML IJ SOLN
INTRAMUSCULAR | Status: AC
  Filled 2023-12-22: qty 5

## 2023-12-22 MED ORDER — LIDOCAINE 2% (20 MG/ML) 5 ML SYRINGE
INTRAMUSCULAR | Status: DC | PRN
  Administered 2023-12-22: 100 mg via INTRAVENOUS

## 2023-12-22 MED ORDER — ACETAMINOPHEN 325 MG PO TABS: 650.0000 mg | ORAL_TABLET | Freq: Four times a day (QID) | ORAL | 0 refills | Status: AC

## 2023-12-22 MED ORDER — BUPIVACAINE-EPINEPHRINE 0.25% -1:200000 IJ SOLN
INTRAMUSCULAR | Status: DC | PRN
  Administered 2023-12-22: 20 mL

## 2023-12-22 MED ORDER — ACETAMINOPHEN 325 MG PO TABS: 650.0000 mg | ORAL_TABLET | ORAL | Status: DC | PRN

## 2023-12-22 MED ORDER — DEXAMETHASONE SODIUM PHOSPHATE 10 MG/ML IJ SOLN
INTRAMUSCULAR | Status: AC
  Filled 2023-12-22: qty 1

## 2023-12-22 MED ORDER — DEXAMETHASONE SODIUM PHOSPHATE 10 MG/ML IJ SOLN
INTRAMUSCULAR | Status: DC | PRN
  Administered 2023-12-22: 10 mg via INTRAVENOUS

## 2023-12-22 MED ORDER — ORAL CARE MOUTH RINSE: 15.0000 mL | Freq: Once | OROMUCOSAL | Status: AC

## 2023-12-22 MED ORDER — OXYCODONE HCL 5 MG PO TABS: 5.0000 mg | ORAL_TABLET | ORAL | Status: DC | PRN

## 2023-12-22 MED ORDER — ONDANSETRON HCL 4 MG/2ML IJ SOLN
INTRAMUSCULAR | Status: AC
  Filled 2023-12-22: qty 2

## 2023-12-22 MED ORDER — KETOROLAC TROMETHAMINE 30 MG/ML IJ SOLN
INTRAMUSCULAR | Status: AC
  Filled 2023-12-22: qty 1

## 2023-12-22 MED ORDER — ROCURONIUM BROMIDE 10 MG/ML (PF) SYRINGE
PREFILLED_SYRINGE | INTRAVENOUS | Status: DC | PRN
  Administered 2023-12-22: 20 mg via INTRAVENOUS
  Administered 2023-12-22: 30 mg via INTRAVENOUS

## 2023-12-22 MED ORDER — MORPHINE SULFATE (PF) 4 MG/ML IV SOLN
4.0000 mg | Freq: Once | INTRAVENOUS | Status: AC
  Administered 2023-12-22: 4 mg via INTRAVENOUS
  Filled 2023-12-22: qty 1

## 2023-12-22 MED ORDER — SUGAMMADEX SODIUM 200 MG/2ML IV SOLN
INTRAVENOUS | Status: DC | PRN
  Administered 2023-12-22: 200 mg via INTRAVENOUS

## 2023-12-22 MED ORDER — CHLORHEXIDINE GLUCONATE 0.12 % MT SOLN
15.0000 mL | Freq: Once | OROMUCOSAL | Status: AC
  Administered 2023-12-22: 15 mL via OROMUCOSAL

## 2023-12-22 MED ORDER — IBUPROFEN 200 MG PO TABS: 600.0000 mg | ORAL_TABLET | Freq: Four times a day (QID) | ORAL | 0 refills | Status: AC

## 2023-12-22 MED ORDER — PROPOFOL 10 MG/ML IV BOLUS
INTRAVENOUS | Status: DC | PRN
  Administered 2023-12-22: 200 mg via INTRAVENOUS

## 2023-12-22 MED ORDER — SODIUM CHLORIDE 0.9 % IV SOLN: 250.0000 mL | INTRAVENOUS | Status: DC | PRN

## 2023-12-22 MED ORDER — BUPIVACAINE-EPINEPHRINE (PF) 0.25% -1:200000 IJ SOLN
INTRAMUSCULAR | Status: AC
  Filled 2023-12-22: qty 30

## 2023-12-22 MED ORDER — MIDAZOLAM HCL 2 MG/2ML IJ SOLN
INTRAMUSCULAR | Status: AC
Start: 2023-12-22 — End: ?
  Filled 2023-12-22: qty 2

## 2023-12-22 MED ORDER — SODIUM CHLORIDE 0.9% FLUSH: 3.0000 mL | INTRAVENOUS | Status: DC | PRN

## 2023-12-22 MED ORDER — KETOROLAC TROMETHAMINE 30 MG/ML IJ SOLN
INTRAMUSCULAR | Status: DC | PRN
  Administered 2023-12-22: 30 mg via INTRAVENOUS

## 2023-12-22 MED ORDER — ACETAMINOPHEN 10 MG/ML IV SOLN
INTRAVENOUS | Status: DC | PRN
  Administered 2023-12-22: 1000 mg via INTRAVENOUS

## 2023-12-22 MED ORDER — LACTATED RINGERS IV SOLN: INTRAVENOUS | Status: DC

## 2023-12-22 MED ORDER — 0.9 % SODIUM CHLORIDE (POUR BTL) OPTIME
TOPICAL | Status: DC | PRN
  Administered 2023-12-22: 1000 mL

## 2023-12-22 MED ORDER — SODIUM CHLORIDE 0.9 % IV SOLN
2.0000 g | Freq: Once | INTRAVENOUS | Status: AC
  Administered 2023-12-22: 2 g via INTRAVENOUS
  Filled 2023-12-22: qty 20

## 2023-12-22 MED ORDER — SUCCINYLCHOLINE CHLORIDE 200 MG/10ML IV SOSY
PREFILLED_SYRINGE | INTRAVENOUS | Status: DC | PRN
  Administered 2023-12-22: 140 mg via INTRAVENOUS

## 2023-12-22 MED ORDER — ACETAMINOPHEN 650 MG RE SUPP: 650.0000 mg | RECTAL | Status: DC | PRN

## 2023-12-22 MED ORDER — OXYCODONE HCL 5 MG PO TABS: 5.0000 mg | ORAL_TABLET | Freq: Three times a day (TID) | ORAL | 0 refills | Status: AC | PRN

## 2023-12-22 MED ORDER — IOHEXOL 300 MG/ML  SOLN
100.0000 mL | Freq: Once | INTRAMUSCULAR | Status: AC | PRN
  Administered 2023-12-22: 100 mL via INTRAVENOUS

## 2023-12-22 MED ORDER — ONDANSETRON HCL 4 MG/2ML IJ SOLN: 4.0000 mg | INTRAMUSCULAR | Status: DC | PRN

## 2023-12-22 MED ORDER — MIDAZOLAM HCL 2 MG/2ML IJ SOLN
INTRAMUSCULAR | Status: DC | PRN
  Administered 2023-12-22: 2 mg via INTRAVENOUS

## 2023-12-22 MED ORDER — METRONIDAZOLE 500 MG/100ML IV SOLN
500.0000 mg | Freq: Once | INTRAVENOUS | Status: AC
  Administered 2023-12-22: 500 mg via INTRAVENOUS
  Filled 2023-12-22: qty 100

## 2023-12-22 MED ORDER — SODIUM CHLORIDE 0.9% FLUSH: 3.0000 mL | Freq: Two times a day (BID) | INTRAVENOUS | Status: DC

## 2023-12-22 MED ORDER — ONDANSETRON HCL 4 MG/2ML IJ SOLN
INTRAMUSCULAR | Status: DC | PRN
  Administered 2023-12-22: 4 mg via INTRAVENOUS

## 2023-12-22 MED ORDER — FENTANYL CITRATE (PF) 250 MCG/5ML IJ SOLN
INTRAMUSCULAR | Status: DC | PRN
  Administered 2023-12-22: 100 ug via INTRAVENOUS
  Administered 2023-12-22: 50 ug via INTRAVENOUS

## 2023-12-22 MED ORDER — MORPHINE SULFATE (PF) 2 MG/ML IV SOLN: 1.0000 mg | INTRAVENOUS | Status: DC | PRN

## 2023-12-22 SURGICAL SUPPLY — 45 items
APPLIER CLIP 5 13 M/L LIGAMAX5 (MISCELLANEOUS) IMPLANT
BAG COUNTER SPONGE SURGICOUNT (BAG) ×1 IMPLANT
BLADE CLIPPER SURG (BLADE) IMPLANT
CANISTER SUCT 3000ML PPV (MISCELLANEOUS) ×1 IMPLANT
CHLORAPREP W/TINT 26 (MISCELLANEOUS) ×1 IMPLANT
CLIP APPLIE 5 13 M/L LIGAMAX5 (MISCELLANEOUS) IMPLANT
COVER SURGICAL LIGHT HANDLE (MISCELLANEOUS) ×1 IMPLANT
CUTTER FLEX LINEAR 45M (STAPLE) IMPLANT
DERMABOND ADVANCED .7 DNX12 (GAUZE/BANDAGES/DRESSINGS) ×1 IMPLANT
ELECT REM PT RETURN 9FT ADLT (ELECTROSURGICAL) ×1 IMPLANT
ELECTRODE REM PT RTRN 9FT ADLT (ELECTROSURGICAL) ×1 IMPLANT
ENDOLOOP SUT PDS II 0 18 (SUTURE) IMPLANT
GLOVE BIO SURGEON STRL SZ7 (GLOVE) ×1 IMPLANT
GOWN STRL REUS W/ TWL LRG LVL3 (GOWN DISPOSABLE) ×1 IMPLANT
GOWN STRL REUS W/TWL XL LVL3 (GOWN DISPOSABLE) ×1 IMPLANT
GRASPER SUT TROCAR 14GX15 (MISCELLANEOUS) IMPLANT
IRRIG SUCT STRYKERFLOW 2 WTIP (MISCELLANEOUS) IMPLANT
IRRIGATION SUCT STRKRFLW 2 WTP (MISCELLANEOUS) IMPLANT
KIT BASIN OR (CUSTOM PROCEDURE TRAY) ×1 IMPLANT
KIT TURNOVER KIT B (KITS) ×1 IMPLANT
NDL INSUFFLATION 14GA 120MM (NEEDLE) IMPLANT
NEEDLE INSUFFLATION 14GA 120MM (NEEDLE) IMPLANT
NS IRRIG 1000ML POUR BTL (IV SOLUTION) ×1 IMPLANT
PAD ARMBOARD 7.5X6 YLW CONV (MISCELLANEOUS) ×2 IMPLANT
PENCIL BUTTON HOLSTER BLD 10FT (ELECTRODE) IMPLANT
RELOAD 45 VASCULAR/THIN (ENDOMECHANICALS) ×1 IMPLANT
RELOAD STAPLE 45 2.5 WHT GRN (ENDOMECHANICALS) IMPLANT
RELOAD STAPLE 45 3.5 BLU ETS (ENDOMECHANICALS) IMPLANT
RELOAD STAPLE TA45 3.5 REG BLU (ENDOMECHANICALS) IMPLANT
SCISSORS LAP 5X35 DISP (ENDOMECHANICALS) IMPLANT
SET TUBE SMOKE EVAC HIGH FLOW (TUBING) ×1 IMPLANT
SHEARS HARMONIC ACE PLUS 36CM (ENDOMECHANICALS) IMPLANT
SLEEVE Z-THREAD 5X100MM (TROCAR) ×1 IMPLANT
SUT MNCRL AB 4-0 PS2 18 (SUTURE) ×1 IMPLANT
SYS BAG RETRIEVAL 10MM (BASKET) ×1 IMPLANT
SYSTEM BAG RETRIEVAL 10MM (BASKET) ×1 IMPLANT
TOWEL GREEN STERILE (TOWEL DISPOSABLE) ×1 IMPLANT
TOWEL GREEN STERILE FF (TOWEL DISPOSABLE) ×1 IMPLANT
TRAY FOLEY W/BAG SLVR 14FR (SET/KITS/TRAYS/PACK) IMPLANT
TRAY LAPAROSCOPIC MC (CUSTOM PROCEDURE TRAY) ×1 IMPLANT
TROCAR 11X100 Z THREAD (TROCAR) IMPLANT
TROCAR Z THREAD OPTICAL 12X100 (TROCAR) ×1 IMPLANT
TROCAR Z-THREAD OPTICAL 5X100M (TROCAR) ×1 IMPLANT
WARMER LAPAROSCOPE (MISCELLANEOUS) ×1 IMPLANT
WATER STERILE IRR 1000ML POUR (IV SOLUTION) ×1 IMPLANT

## 2023-12-22 NOTE — Transfer of Care (Signed)
 Immediate Anesthesia Transfer of Care Note  Patient: Jill Hutchinson [name]  Procedure(s) Performed: APPENDECTOMY LAPAROSCOPIC  Patient Location: PACU  Anesthesia Type:General  Level of Consciousness: awake and sedated  Airway & Oxygen Therapy: Patient Spontanous Breathing  Post-op Assessment: Report given to RN and Post -op Vital signs reviewed and stable  Post vital signs: Reviewed and stable  Last Vitals:  Vitals Value Taken Time  BP 104/67 12/22/23 1806  Temp    Pulse 77 12/22/23 1808  Resp 14 12/22/23 1808  SpO2 99 % 12/22/23 1808  Vitals shown include unfiled device data.  Last Pain:  Vitals:   12/22/23 1610  TempSrc:   PainSc: 6          Complications: No notable events documented.

## 2023-12-22 NOTE — Discharge Instructions (Addendum)
 Surgery Home Care Instruction  Activity  The effects of anesthesia are still present and drowsiness may result.  Limit activity for the first 24 hours, then you may return to normal daily activities. Returning to normal daily activities as soon as you can following surgery will enhance recovery time.  Do not drive or operate heavy machinery within 24 hours of taking narcotic pain medications.   Do not mow the lawn, use a vacuum cleaner, or do any other strenuous activities without first consulting your surgical team.   Diet Drink plenty of fluids and eat light meals today, then resume regular diet. Some patients may find their appetite is poor for a week or two after surgery. This is a normal result of the stress of surgery-your appetite will return in time.   There are no specific diet restrictions after surgery.  Dressing and Wound Care  Keep your wound or incision site clean and dry.  You may have different types of dressings covering your incisions depending on your operation and your surgeon: o Dermabond/Durabond (skin glue): This will usually remain in place for 10-14 days, then naturally fall off your skin. You may take a shower 24 hrs after surgery, carefully wash, not scrub the incision site with a mild non-scented soap. Pat dry with a soft towel.  Do not pick or peel skin glue off.  You can shower and let the water fall on the dressings above. Do not soak or submerge your incision(s) in a bath tub, hot tub, or swimming pool, until your doctor says it is ok to do so or the incision(s) have completely healed, usually about 2-4 weeks.  Do not use creams, powder, salves or balms on your incision(s).  What to Expect After Surgery   Moderate discomfort controlled with medications  Minimal drainage from incision  Feeling fatigue and weak  Constipation after surgery is common. Drink plenty fluids and eat a high fiber diet.   Pain Control: Prescribed Non-Narcotic Pain Medication  You  will be given three prescriptions.  Two of them will be for prescription strength ibuprofen  (i.e. Advil ) and prescription strength acetaminophen  (i.e. Tylenol ).  The vast majority of patients will just need these two medications.  One prescription will be for a 'rescue' prescription of an oral narcotic (oxycodone ).  You may fill this if needed.  You will alternate taking the ibuprofen  (600mg ) every 6 hours and also the acetaminophen  (650mg ) every 6 hours so that you are taking one of those medications every 3 hours.  For example: o 0800 - take ibuprofen  600mg  o 1100 - take acetaminophen  650mg  o 1400 - take ibuprofen  600mg  o 1700 - take acetaminophen  650mg  o Etc.  Continue taking this alternating pattern of ibuprofen  and acetaminophen  for 3 days  If you cannot take one or the other of these medications, just take the one you can every 6 hours.  If you are comfortable at night, you don't have to wake up and take a medication.  If you are still uncomfortable after taking either ibuprofen  or acetaminophen , try gentle stretching exercise and ice packs (a bag of frozen vegetables works great).  If you are still uncomfortable, you may fill the narcotic prescription of Oxycodone  and take as directed.  Once you have completed these prescriptions, your pain level should be low enough to stop taking medications altogether or just use an over the counter medication (ibuprofen  or acetaminophen ) as needed.    Pain Control: Over the Counter Medications to take as needed  Colace/Docusate: May be prescribed by your surgeon to prevent constipation caused by the combination of narcotics, effects of anesthesia, and decreased ambulation.  Hold for loose stools or diarrhea. Take 100 mg 1-2 times a day starting tonight.   Fiber: High fiber foods, extra liquids (water 9-13 cups/day) can also assist with constipation. Examples of high fiber foods are fruit, bran. Prune juice and water are also good liquids to drink.  Milk  of Magnesia/Miralax:  If constipated despite takeing the over the counter stool softeners, you may take Milk of Magnesia or Miralax as directed on bottle to assist with constipation.     Pepcid/Famotidine: May be prescribed while taking naproxen (Aleve) or other NSAIDs such as ibuprofen  (Motrin /Advil ) to prevent stomach upset or Acid-reflux symptoms. Take 1 tablet 1-2 times a day.   **Constipation: The first bowel movement may occur anywhere between 1-5 days after surgery.  As long as you are not nauseated or not having significant abdominal pain this variation is acceptable. Narcotic pain medications can cause constipation increasing discomfort; early discontinuation will assist with bowel management. If constipated despite taking stool softeners, you may take Milk of Magnesia or Miralax as directed on the bottle.     **Home medications: You may restart your home medications as directed by your respective Primary Care Physician or Surgeon.   When to notify your Doctor or Healthcare Team   Sign of Wound Infection   Fever over 100 degrees.  Wound becomes extremely swollen, shows red streaks, warm to the touch, and/or drainage from the incision site or foul-smelling drainage.  Wound edges separate or opens up  Bleeding or bruising   If you have bleeding, apply pressure to the site and hold the pressure firmly for 5 minutes. If the bleeding continues, apply pressure again and call 911. If the bleeding stopped, call your doctor to report it.   Call your doctor or nurse if you have increased bleeding from your site and increased bruising or a lump forms or gets larger under your skin at the site. Unrelieved Pain   Call your doctor or nurse if your pain gets worse or is not eased 1 hour after taking your pain medicine, or if it is severe and uncontrolled. Nausea and Vomiting   Call your doctor or nurse if you have nausea and vomiting that continues more than 24 hours, will not let you keep medicine down  and will not let you keep fluids down  Fever, Flu-like symptoms   Fever over 100 degrees and/or chills  Gastrointestinal Bleeding Symptoms    Black tarry bowel movements.  This can be normal after surgery on the stomach, but should resolve in a day or two.    Call 911 if you suddenly have signs of blood loss such as:  Vomiting blood  Fast heart rate  Feeling faint, sweaty, or blacking out  Passing bright red blood from your rectum  Blood Clot Symptoms   Tender, swollen or reddened areas in your calf muscle or thighs.  Numbness or tingling in your lower leg or calf, or at the top of your leg or groin  Skin on your leg looks pale or blue or feels cold to touch  Chest pain or have trouble breathing, lightheadedness, fast heart rate  Sudden Onset of Symptoms    Call 911 if you suddenly have:  Leg weakness and spasm  Loss of bladder or bowel function  Seizure  Confusion, severe headache, dizziness or feeling unsteady, problems talking, difficulty swallowing, and/or  numbness or muscle weakness as these could be signs of a stroke.  Follow up Appointment Your follow up appointment should be scheduled 2-3 weeks after your surgery date.  If you have not previously scheduled for a follow-up visit you can be scheduled by contacting [phone].

## 2023-12-22 NOTE — Anesthesia Procedure Notes (Signed)
 Procedure Name: Intubation Date/Time: 12/22/2023 5:06 PM  Performed by: Delores Dus, CRNAPre-anesthesia Checklist: Patient identified, Emergency Drugs available, Suction available and Patient being monitored Patient Re-evaluated:Patient Re-evaluated prior to induction Oxygen Delivery Method: Circle system utilized Preoxygenation: Pre-oxygenation with 100% oxygen Induction Type: IV induction Ventilation: Mask ventilation without difficulty Laryngoscope Size: Miller and 2 Grade View: Grade I Tube type: Oral Tube size: 7.0 mm Number of attempts: 1 Airway Equipment and Method: Stylet and Oral airway Placement Confirmation: ETT inserted through vocal cords under direct vision, positive ETCO2 and breath sounds checked- equal and bilateral Secured at: 22 cm Tube secured with: Tape Dental Injury: Teeth and Oropharynx as per pre-operative assessment

## 2023-12-22 NOTE — ED Triage Notes (Signed)
 C/o RLQ pain starting last night w/ N/V. Reports worse pain w/ ambulating.

## 2023-12-22 NOTE — Op Note (Signed)
 Preoperative diagnosis: acute appendicitis  Postoperative diagnosis: Same   Procedure: laparoscopic appendectomy  Surgeon: Cordella Idler, M.D.  Asst: None  Anesthesia: General endotracheal  Indications for procedure: Jill Hutchinson is a 48 y.o. female with symptoms of pain in right lower quadrant consistent with acute appendicitis. Confirmed by CT scan and laboratory values. The procedure, material risks, benefits and alternatives to surgery were discussed at length with the patient. The patient's questions were answered and the patient elected to proceed with the planned procedure.  Description of procedure: The patient was brought into the operating room, placed in the supine position. General endotracheal anesthesia was administered with endotracheal tube. The patient's left arm was tucked. All pressure points were offloaded by foam padding. The patient was prepped and draped in the usual sterile fashion.  A veress needle was inserted into the abdomen in the LUQ at Palmer's point. After verifying correct placement with aspiration and a saline drop test, the abdomen was insufflated to .  A 5-mm trocar was inserted into the abdomen just left of the umbilicus using an opti-view technique.  The laparoscope was introduced and the abdominal contents inspected to ensure that there were no signs of injury.  An additional 5-mm port was placed in the suprapubic position and a 12-mm port was placed in the LLQ under direct visualization.  All trocars sites were first anesthesized with 0.25% marcaine  with epinephrine . Next the patient was placed in trendelenberg, rotated to the left. The omentum was retracted cephalad. The cecum and appendix were identified. The tip of appendix was inflamed and adherent to the side wall. The mesoappendix was dissected toward the base of the appendix using a bipolar cautery device.  The base of the appendix appeared healthy.  A 45mm white load stapler was used  to transect the appendix at the base.  The appendix was placed in a specimen bag.The appendix was removed via the LLQ incision. 0 vicryl was used to close the fascial defect from the 12-mm port. All trocars were removed. All incisions were closed with 4-0 monocryl. The patient woke from anesthesia and was brought to PACU in stable condition.  Findings: inflamed appendix   Specimen: appendix for permanent  Blood loss: 10mL  Local anesthesia: 25mL 0.25% marcaine  w/ epinephrine   Complications: None  Cordella Idler, M.D. General Surgery [facility], [place]

## 2023-12-22 NOTE — Anesthesia Preprocedure Evaluation (Signed)
 Anesthesia Evaluation  Patient identified by MRN, date of birth, ID band Patient awake    Reviewed: Allergy & Precautions, NPO status , Patient's Chart, lab work & pertinent test results  Airway Mallampati: II  TM Distance: >3 FB Neck ROM: Full    Dental no notable dental hx. (+) Teeth Intact, Dental Advisory Given   Pulmonary neg pulmonary ROS   Pulmonary exam normal breath sounds clear to auscultation       Cardiovascular negative cardio ROS Normal cardiovascular exam Rhythm:Regular Rate:Normal     Neuro/Psych negative neurological ROS  negative psych ROS   GI/Hepatic negative GI ROS, Neg liver ROS,,,  Endo/Other  negative endocrine ROS    Renal/GU Lab Results      Component                Value               Date                      NA                       137                 12/22/2023                CL                       102                 12/22/2023                K                        3.7                 12/22/2023                CO2                      25                  12/22/2023                BUN                      11                  12/22/2023                CREATININE               0.58                12/22/2023                GFRNONAA                 >60                 12/22/2023                CALCIUM                  9.3                 12/22/2023  ALBUMIN                  4.2                 12/22/2023                GLUCOSE                  106 (H)             12/22/2023                Musculoskeletal   Abdominal   Peds  Hematology negative hematology ROS (+) Lab Results      Component                Value               Date                      WBC                      15.1 (H)            12/22/2023                HGB                      11.7 (L)            12/22/2023                HCT                      35.6 (L)            12/22/2023                MCV                       78.6 (L)            12/22/2023                PLT                      349                 12/22/2023              Anesthesia Other Findings   Reproductive/Obstetrics negative OB ROS                              Anesthesia Physical Anesthesia Plan  ASA: 2  Anesthesia Plan: General   Post-op Pain Management: Tylenol  PO (pre-op)* and Precedex   Induction: Intravenous  PONV Risk Score and Plan: 4 or greater and Treatment may vary due to age or medical condition, Midazolam , Ondansetron  and Dexamethasone   Airway Management Planned: Oral ETT  Additional Equipment: None  Intra-op Plan:   Post-operative Plan: Extubation in OR  Informed Consent: I have reviewed the patients History and Physical, chart, labs and discussed the procedure including the risks, benefits and alternatives for the proposed anesthesia with the patient or authorized representative who has indicated his/her understanding and acceptance.     Dental advisory given  Plan Discussed with: CRNA  Anesthesia Plan Comments:  Anesthesia Quick Evaluation

## 2023-12-22 NOTE — H&P (Signed)
 Jill Hutchinson 22-Aug-1976  [id number].    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 Curvin in 2023 who began having lower abdominal pain last night that has migrated to the RLQ.  She saw her PCP office who referred her to the ED for concern for appendicitis.  She denies any other symptoms or anything that made her symptoms better.  No fevers, chills, SOB, CP, N/V/D.    In the ED she has a CT scan that reveals acute appendicitis and a WBC of 15K.  We have been asked to see her for further management.  ROS: ROS: see HPI  Family History  Problem Relation Age of Onset   Breast cancer Neg Hx     History reviewed. No pertinent past medical history.  Past Surgical History:  Procedure Laterality Date   BREAST BIOPSY Left 03/27/2022   BREAST LUMPECTOMY WITH RADIOACTIVE SEED LOCALIZATION Left 05/09/2022   Procedure: LEFT BREAST LUMPECTOMY WITH RADIOACTIVE SEED LOCALIZATION;  Surgeon: Curvin Deward MOULD, MD;  Location: [facility];  Service: General;  Laterality: Left;   CESAREAN SECTION WITH BILATERAL TUBAL LIGATION      Social History:  reports that she has never smoked. She has never used smokeless tobacco. She reports that she does not drink alcohol and does not use drugs.  Allergies: No Known Allergies  (Not in a hospital admission)    Physical Exam: Blood pressure 108/73, pulse 80, temperature 99.1 F (37.3 C), temperature source Oral, resp. rate 17, height 5' 7 (1.702 m), weight 90 kg, SpO2 98%. General: pleasant, WD, WN female who is laying in bed in NAD HEENT: head is normocephalic, atraumatic.  Sclera are noninjected.  PERRL.  Ears and nose without any masses or lesions.  Mouth is pink. Heart: regular, rate, and rhythm.  Normal s1,s2. No obvious murmurs, gallops, or rubs noted.   Lungs: CTAB, no wheezes, rhonchi, or rales noted.   Respiratory effort nonlabored Abd: soft, tender in RLQ, no peritonitis, ND, obese, +BS, no masses, hernias, or organomegaly Psych: A&Ox3 with an appropriate affect.   Results for orders placed or performed during the hospital encounter of 12/22/23 (from the past 48 hours)  CBC with Differential     Status: Abnormal   Collection Time: 12/22/23 10:26 AM  Result Value Ref Range   WBC 15.1 (H) 4.0 - 10.5 K/uL   RBC 4.53 3.87 - 5.11 MIL/uL   Hemoglobin 11.7 (L) 12.0 - 15.0 g/dL   HCT 64.3 (L) 63.9 - 53.9 %   MCV 78.6 (L) 80.0 - 100.0 fL   MCH 25.8 (L) 26.0 - 34.0 pg   MCHC 32.9 30.0 - 36.0 g/dL   RDW 85.0 88.4 - 84.4 %   Platelets 349 150 - 400 K/uL   nRBC 0.0 0.0 - 0.2 %   Neutrophils Relative % 80 %   Neutro Abs 12.0 (H) 1.7 - 7.7 K/uL   Lymphocytes Relative 10 %   Lymphs Abs 1.6 0.7 - 4.0 K/uL   Monocytes Relative 9 %   Monocytes Absolute 1.3 (H) 0.1 - 1.0 K/uL   Eosinophils Relative 1 %   Eosinophils Absolute 0.1 0.0 - 0.5 K/uL   Basophils Relative 0 %   Basophils Absolute 0.0 0.0 - 0.1 K/uL   Immature Granulocytes 0 %   Abs Immature Granulocytes 0.05 0.00 - 0.07 K/uL  Comment: Performed at [facility], [address], [place], [place] [address]  Comprehensive metabolic panel     Status: Abnormal   Collection Time: 12/22/23 10:26 AM  Result Value Ref Range   Sodium 137 135 - 145 mmol/L   Potassium 3.7 3.5 - 5.1 mmol/L   Chloride 102 98 - 111 mmol/L   CO2 25 22 - 32 mmol/L   Glucose, Bld 106 (H) 70 - 99 mg/dL    Comment: Glucose reference range applies only to samples taken after fasting for at least 8 hours.   BUN 11 6 - 20 mg/dL   Creatinine, Ser 9.41 0.44 - 1.00 mg/dL   Calcium 9.3 8.9 - 89.6 mg/dL   Total Protein 6.7 6.5 - 8.1 g/dL   Albumin 4.2 3.5 - 5.0 g/dL   AST 14 (L) 15 - 41 U/L   ALT 15 0 - 44 U/L   Alkaline Phosphatase 48 38 - 126 U/L   Total Bilirubin 1.2 0.0 - 1.2 mg/dL   GFR, Estimated >39 >39 mL/min    Comment: (NOTE) Calculated  using the CKD-EPI Creatinine Equation (2021)    Anion gap 10 5 - 15    Comment: Performed at [facility], [address] [place], [place] [address]  Lipase, blood     Status: None   Collection Time: 12/22/23 10:26 AM  Result Value Ref Range   Lipase 13 11 - 51 U/L    Comment: Performed at [facility], [address], [place], [place] [address]  Urinalysis, Routine w reflex microscopic -Urine, Clean Catch     Status: Abnormal   Collection Time: 12/22/23 11:52 AM  Result Value Ref Range   Color, Urine YELLOW YELLOW   APPearance CLEAR CLEAR   Specific Gravity, Urine 1.009 1.005 - 1.030   pH 6.0 5.0 - 8.0   Glucose, UA NEGATIVE NEGATIVE mg/dL   Hgb urine dipstick MODERATE (A) NEGATIVE   Bilirubin Urine NEGATIVE NEGATIVE   Ketones, ur NEGATIVE NEGATIVE mg/dL   Protein, ur NEGATIVE NEGATIVE mg/dL   Nitrite NEGATIVE NEGATIVE   Leukocytes,Ua NEGATIVE NEGATIVE   RBC / HPF 0-5 0 - 5 RBC/hpf   WBC, UA 0-5 0 - 5 WBC/hpf   Bacteria, UA NONE SEEN NONE SEEN   Squamous Epithelial / HPF 0-5 0 - 5 /HPF   Mucus PRESENT     Comment: Performed at [facility], [address], [place], [place] [address]   CT ABDOMEN PELVIS W CONTRAST Result Date: 12/22/2023 CLINICAL DATA:  Right lower quadrant abdominal pain. EXAM: CT ABDOMEN AND PELVIS WITH CONTRAST TECHNIQUE: Multidetector CT imaging of the abdomen and pelvis was performed using the standard protocol following bolus administration of intravenous contrast. RADIATION DOSE REDUCTION: This exam was performed according to the departmental dose-optimization program which includes automated exposure control, adjustment of the mA and/or kV according to patient size and/or use of iterative reconstruction technique. CONTRAST:  100mL OMNIPAQUE  IOHEXOL  300 MG/ML  SOLN COMPARISON:  None Available. FINDINGS: Lower chest: The visualized lung bases are clear. No intra-abdominal free air or free fluid.  Hepatobiliary: Fatty liver. No biliary dilatation. The gallbladder is unremarkable. Pancreas: Unremarkable. No pancreatic ductal dilatation or surrounding inflammatory changes. Spleen: Normal in size without focal abnormality. Adrenals/Urinary Tract: The adrenal glands are unremarkable. The kidneys, visualized ureters, and urinary bladder appear unremarkable. Stomach/Bowel: There is no bowel obstruction. The appendix is enlarged and inflamed measuring up to 12 mm in thickness. There is a 12 mm stone at  the base of the appendix. The appendix is located in the right hemipelvis posterior to the cecum and extends up posterior to the ascending colon. No drainable fluid collection/abscess. No evidence of perforation. Vascular/Lymphatic: The abdominal aorta and IVC are unremarkable. No portal venous gas. There is no adenopathy. Reproductive: The uterus is anteverted. Apparent ill-defined 17 mm nodular density in the endometrium suspicious for a polyp or fibroid. Further evaluation with pelvic ultrasound on a nonemergent/outpatient basis recommended. No suspicious adnexal masses. Mildly dilated fallopian tubes likely represent mild hydrosalpinx. Other: None Musculoskeletal: No acute or significant osseous findings. IMPRESSION: 1. Acute appendicitis. No perforation or abscess. 2. Fatty liver. 3. Probable endometrial polyp or a small submucosal/intracavitary fibroid. Further evaluation with pelvic ultrasound on a nonemergent/outpatient basis recommended. Electronically Signed   By: Vanetta Chou M.D.   On: 12/22/2023 12:19      Assessment/Plan Acute appendicitis The patient has been seen, examined, labs, vitals, chart, and imaging personally reviewed.  She appears to have acute uncomplicated appendicitis.  We will plan to proceed with lap appy today for definitive management as she does have an appendicolith noted as well.  I have discussed the procedure and risks of appendectomy. The risks include but are not  limited to bleeding, infection, wound problems, anesthesia, injury to intra-abdominal organs, possibility of postoperative ileus. She seems to understand and agrees with the plan.   FEN - NPO VTE - SCDs ID - Rocephin /Flagyl  Admit - obs, possibly home from PACU vs overnight stay  I reviewed ED provider notes, last 24 h vitals and pain scores, last 48 h intake and output, last 24 h labs and trends, and last 24 h imaging results.  Burnard FORBES Banter, PA-C [facility] 12/22/2023, 1:16 PM Please see Amion for pager number during day hours 7:00am-4:30pm or 7:00am -11:30am on weekends

## 2023-12-22 NOTE — ED Provider Notes (Signed)
 [place] EMERGENCY DEPARTMENT AT [facility] Provider Note   CSN: [id number] Arrival date & time: 12/22/23  9083     History  Chief Complaint  Patient presents with   Abdominal Pain    Jill Hutchinson is a 47 y.o. female.  With a history of C-section with bilateral tubal ligation presenting to the ED for evaluation of lower abdominal pain.  Pain began in the left lower quadrant yesterday evening and progressed to the right lower quadrant this morning.  There is some radiation to the right low back.  Presented to [facility] and was referred to the emergency department for potential appendicitis.  She denies any fevers or chills.  No nausea or vomiting.  Pain is worse with movement.  No dysuria, frequency or urgency.  No changes in bowel habits.   Abdominal Pain      Home Medications Prior to Admission medications   Medication Sig Start Date End Date Taking? Authorizing Provider  lisdexamfetamine (VYVANSE) 50 MG capsule Take 50 mg by mouth daily. 12/21/23  Yes [provider]  cholecalciferol (VITAMIN D3) 25 MCG (1000 UNIT) tablet Take 1,000 Units by mouth daily.    [provider]  magnesium 30 MG tablet Take 30 mg by mouth 2 (two) times daily.    [provider]  Multiple Vitamins-Minerals (MULTIVITAMIN WITH MINERALS) tablet Take 1 tablet by mouth daily.    [provider]  oxyCODONE  (ROXICODONE ) 5 MG immediate release tablet Take 1 tablet (5 mg total) by mouth every 6 (six) hours as needed for severe pain. 05/09/22   Curvin Deward MOULD, MD      Allergies    Patient has no known allergies.    Review of Systems   Review of Systems  Gastrointestinal:  Positive for abdominal pain.  All other systems reviewed and are negative.   Physical Exam Updated Vital Signs BP 111/73 (BP Location: Left Arm)   Pulse 79   Temp 99.1 F (37.3 C) (Oral)   Resp 17   Ht 5' 7 (1.702 m)   Wt 90 kg   SpO2 99%   BMI 31.08 kg/m   Physical Exam Vitals and nursing note reviewed.  Constitutional:      General: She is not in acute distress.    Appearance: Normal appearance. She is well-developed and normal weight. She is not ill-appearing.     Comments: Resting comfortably in bed  HENT:     Head: Normocephalic and atraumatic.  Pulmonary:     Effort: Pulmonary effort is normal. No respiratory distress.  Abdominal:     General: Abdomen is flat. Bowel sounds are normal.     Palpations: Abdomen is soft.     Tenderness: There is abdominal tenderness in the right lower quadrant. There is guarding. Positive signs include McBurney's sign.  Musculoskeletal:        General: Normal range of motion.     Cervical back: Neck supple.  Skin:    General: Skin is warm and dry.  Neurological:     Mental Status: She is alert and oriented to person, place, and time.  Psychiatric:        Mood and Affect: Mood normal.        Behavior: Behavior normal.     ED Results / Procedures / Treatments   Labs (all labs ordered are listed, but only abnormal results are displayed) Labs Reviewed  CBC WITH DIFFERENTIAL/PLATELET - Abnormal; Notable for the following components:  Result Value   WBC 15.1 (*)    Hemoglobin 11.7 (*)    HCT 35.6 (*)    MCV 78.6 (*)    MCH 25.8 (*)    Neutro Abs 12.0 (*)    Monocytes Absolute 1.3 (*)    All other components within normal limits  COMPREHENSIVE METABOLIC PANEL - Abnormal; Notable for the following components:   Glucose, Bld 106 (*)    AST 14 (*)    All other components within normal limits  URINALYSIS, ROUTINE W REFLEX MICROSCOPIC - Abnormal; Notable for the following components:   Hgb urine dipstick MODERATE (*)    All other components within normal limits  LIPASE, BLOOD    EKG None  Radiology CT ABDOMEN PELVIS W CONTRAST Result Date: 12/22/2023 CLINICAL DATA:  Right lower quadrant abdominal pain. EXAM: CT ABDOMEN AND PELVIS WITH CONTRAST TECHNIQUE: Multidetector CT imaging of the  abdomen and pelvis was performed using the standard protocol following bolus administration of intravenous contrast. RADIATION DOSE REDUCTION: This exam was performed according to the departmental dose-optimization program which includes automated exposure control, adjustment of the mA and/or kV according to patient size and/or use of iterative reconstruction technique. CONTRAST:  OMNIPAQUE  IOHEXOL  300 MG/ML  SOLN COMPARISON:  None Available. FINDINGS: Lower chest: The visualized lung bases are clear. No intra-abdominal free air or free fluid. Hepatobiliary: Fatty liver. No biliary dilatation. The gallbladder is unremarkable. Pancreas: Unremarkable. No pancreatic ductal dilatation or surrounding inflammatory changes. Spleen: Normal in size without focal abnormality. Adrenals/Urinary Tract: The adrenal glands are unremarkable. The kidneys, visualized ureters, and urinary bladder appear unremarkable. Stomach/Bowel: There is no bowel obstruction. The appendix is enlarged and inflamed measuring up to 12 mm in thickness. There is a 12 mm stone at the base of the appendix. The appendix is located in the right hemipelvis posterior to the cecum and extends up posterior to the ascending colon. No drainable fluid collection/abscess. No evidence of perforation. Vascular/Lymphatic: The abdominal aorta and IVC are unremarkable. No portal venous gas. There is no adenopathy. Reproductive: The uterus is anteverted. Apparent ill-defined 17 mm nodular density in the endometrium suspicious for a polyp or fibroid. Further evaluation with pelvic ultrasound on a nonemergent/outpatient basis recommended. No suspicious adnexal masses. Mildly dilated fallopian tubes likely represent mild hydrosalpinx. Other: None Musculoskeletal: No acute or significant osseous findings. IMPRESSION: 1. Acute appendicitis. No perforation or abscess. 2. Fatty liver. 3. Probable endometrial polyp or a small submucosal/intracavitary fibroid. Further  evaluation with pelvic ultrasound on a nonemergent/outpatient basis recommended. Electronically Signed   By: Vanetta Chou M.D.   On: 12/22/2023 12:19    Procedures Procedures    Medications Ordered in ED Medications  cefTRIAXone  (ROCEPHIN ) 2 g in sodium chloride  0.9 % 100 mL IVPB (has no administration in time range)  metroNIDAZOLE  (FLAGYL ) IVPB 500 mg (has no administration in time range)  morphine  (PF) 2 MG/ML injection 1-4 mg (has no administration in time range)  ondansetron  (ZOFRAN ) injection 4 mg (has no administration in time range)  acetaminophen  (TYLENOL ) tablet 1,000 mg (has no administration in time range)  morphine  (PF) 4 MG/ML injection 4 mg (4 mg Intravenous Given 12/22/23 1218)  iohexol  (OMNIPAQUE ) 300 MG/ML solution 100 mL (100 mLs Intravenous Contrast Given 12/22/23 1206)    ED Course/ Medical Decision Making/ A&P                                 Medical  Decision Making Amount and/or Complexity of Data Reviewed Labs: ordered. Radiology: ordered.  Risk Prescription drug management. Decision regarding hospitalization.  This patient presents to the ED for concern of right lower quadrant abdominal pain, this involves an extensive number of treatment options, and is a complaint that carries with it a high risk of complications and morbidity. Differential diagnosis of her lower abdominal considerations include pelvic inflammatory disease, ectopic pregnancy, appendicitis, urinary calculi, primary dysmenorrhea, septic abortion, ruptured ovarian cyst or tumor, ovarian torsion, tubo-ovarian abscess, degeneration of fibroid, endometriosis, diverticulitis, cystitis.   My initial workup includes labs, imaging.  Patient declines pain medication  Additional history obtained from: Nursing notes from this visit. Family husband at bedside provides portion of the history  I ordered, reviewed and interpreted labs which include: CBC, CMP, lipase, urinalysis  I ordered imaging  studies including CT abdomen pelvis I independently visualized and interpreted imaging which showed acute uncomplicated appendicitis.  Probable endometrial polyp or fibroid with recommendation for nonemergent outpatient follow-up I agree with the radiologist interpretation  Consultations Obtained:  I requested consultation with general surgery PA, Vertell Pringle,  and discussed lab and imaging findings as well as pertinent plan - they recommend: Transfer by POV to [facility] pre op  Afebrile, hemodynamically stable.  48 year old female presenting to the ED for evaluation of right lower quadrant pain.  Symptoms began yesterday evening in the left lower quadrant and had radiates to the right lower quadrant.  She has tenderness to palpation in McBurney's point on exam with some peritoneal signs.  Lab workup was with a leukocytosis of 15.1.  No significant anemia.  CT abdomen pelvis reports appendicitis without perforation or abscess.  Discussed case with general surgery.  Patient is hemodynamically stable.  Is to present to [facility] preoperative area by private vehicle for appendectomy.  IV antibiotics provided prior to discharge from emergency department.  Discussed this plan with the patient and husband at bedside who are in agreement.  They were encouraged to present directly to the preoperative area and to not make any stops on the way.  Her last oral intake was 24 hours prior to ED arrival.  She was also encouraged to follow-up with her primary care provider regarding her endometrial fibroid versus polyp.  Stable at the time of transfer  Patient's case discussed with Dr. Emil who agrees with plan to transfer for appendectomy.   Note: Portions of this report may have been transcribed using voice recognition software. Every effort was made to ensure accuracy; however, inadvertent computerized transcription errors may still be present.         Final Clinical Impression(s) / ED Diagnoses Final  diagnoses:  Acute appendicitis with localized peritonitis, without perforation, abscess, or gangrene  Abnormal finding on imaging    Rx / DC Orders ED Discharge Orders     None         Edwardo Marsa HERO, PA-C 12/22/23 1304    Emil Share, DO 12/22/23 1312

## 2023-12-22 NOTE — ED Notes (Signed)
 Pt transferring to [facility]. Report called. IV saline locked and wrapped with coban, secured. A/ox4, VSS. Husband driving pt and aware to remain NPO.

## 2023-12-23 ENCOUNTER — Encounter (HOSPITAL_COMMUNITY): Payer: Self-pay | Admitting: General Surgery

## 2023-12-24 LAB — SURGICAL PATHOLOGY

## 2023-12-25 NOTE — Anesthesia Postprocedure Evaluation (Signed)
 Anesthesia Post Note  Patient: Jill Hutchinson  Procedure(s) Performed: APPENDECTOMY LAPAROSCOPIC     Patient location during evaluation: PACU Anesthesia Type: General Level of consciousness: sedated and patient cooperative Pain management: pain level controlled Vital Signs Assessment: post-procedure vital signs reviewed and stable Respiratory status: spontaneous breathing Cardiovascular status: stable Anesthetic complications: no   No notable events documented.  Last Vitals:  Vitals:   12/22/23 1830 12/22/23 1845  BP: 110/67 114/67  Pulse: 73 75  Resp: 16 17  Temp:  37.1 C  SpO2: 96% 94%    Last Pain:  Vitals:   12/22/23 1845  TempSrc:   PainSc: 0-No pain                 Norleen Pope

## 2024-02-07 ENCOUNTER — Emergency Department (HOSPITAL_BASED_OUTPATIENT_CLINIC_OR_DEPARTMENT_OTHER)
Admission: EM | Admit: 2024-02-07 | Discharge: 2024-02-07 | Disposition: A | Discharge status: Home / Self Care | Payer: Medicaid Other | Attending: Emergency Medicine | Admitting: Emergency Medicine

## 2024-02-07 ENCOUNTER — Emergency Department (HOSPITAL_BASED_OUTPATIENT_CLINIC_OR_DEPARTMENT_OTHER): Payer: Medicaid Other

## 2024-02-07 ENCOUNTER — Other Ambulatory Visit: Payer: Self-pay

## 2024-02-07 DIAGNOSIS — D259 Leiomyoma of uterus, unspecified: Secondary | ICD-10-CM | POA: Diagnosis not present

## 2024-02-07 DIAGNOSIS — R109 Unspecified abdominal pain: Secondary | ICD-10-CM | POA: Diagnosis present

## 2024-02-07 DIAGNOSIS — E119 Type 2 diabetes mellitus without complications: Secondary | ICD-10-CM | POA: Insufficient documentation

## 2024-02-07 DIAGNOSIS — B9689 Other specified bacterial agents as the cause of diseases classified elsewhere: Secondary | ICD-10-CM | POA: Diagnosis not present

## 2024-02-07 DIAGNOSIS — N760 Acute vaginitis: Secondary | ICD-10-CM | POA: Insufficient documentation

## 2024-02-07 LAB — CBC
HCT: 38.6 % (ref 36.0–46.0)
Hemoglobin: 12.2 g/dL (ref 12.0–15.0)
MCH: 25.3 pg — ABNORMAL LOW (ref 26.0–34.0)
MCHC: 31.6 g/dL (ref 30.0–36.0)
MCV: 79.9 fL — ABNORMAL LOW (ref 80.0–100.0)
Platelets: 457 10*3/uL — ABNORMAL HIGH (ref 150–400)
RBC: 4.83 MIL/uL (ref 3.87–5.11)
RDW: 15.6 % — ABNORMAL HIGH (ref 11.5–15.5)
WBC: 12.5 10*3/uL — ABNORMAL HIGH (ref 4.0–10.5)
nRBC: 0 % (ref 0.0–0.2)

## 2024-02-07 LAB — URINALYSIS, ROUTINE W REFLEX MICROSCOPIC
Bilirubin Urine: NEGATIVE
Glucose, UA: NEGATIVE mg/dL
Ketones, ur: NEGATIVE mg/dL
Nitrite: NEGATIVE
Protein, ur: NEGATIVE mg/dL
Specific Gravity, Urine: 1.008 (ref 1.005–1.030)
pH: 5.5 (ref 5.0–8.0)

## 2024-02-07 LAB — PREGNANCY, URINE: Preg Test, Ur: NEGATIVE

## 2024-02-07 LAB — COMPREHENSIVE METABOLIC PANEL
ALT: 20 U/L (ref 0–44)
AST: 25 U/L (ref 15–41)
Albumin: 4.8 g/dL (ref 3.5–5.0)
Alkaline Phosphatase: 56 U/L (ref 38–126)
Anion gap: 10 (ref 5–15)
BUN: 12 mg/dL (ref 6–20)
CO2: 24 mmol/L (ref 22–32)
Calcium: 10 mg/dL (ref 8.9–10.3)
Chloride: 102 mmol/L (ref 98–111)
Creatinine, Ser: 0.67 mg/dL (ref 0.44–1.00)
GFR, Estimated: >60 mL/min (ref >60)
Glucose, Bld: 94 mg/dL (ref 70–99)
Potassium: 4.3 mmol/L (ref 3.5–5.1)
Sodium: 136 mmol/L (ref 135–145)
Total Bilirubin: 0.7 mg/dL (ref 0.0–1.2)
Total Protein: 7.5 g/dL (ref 6.5–8.1)

## 2024-02-07 LAB — WET PREP, GENITAL
Sperm: NONE SEEN
Trich, Wet Prep: NONE SEEN
WBC, Wet Prep HPF POC: <10 (ref <10)
Yeast Wet Prep HPF POC: NONE SEEN

## 2024-02-07 LAB — LIPASE, BLOOD: Lipase: 28 U/L (ref 11–51)

## 2024-02-07 MED ORDER — IOHEXOL 350 MG/ML SOLN
100.0000 mL | Freq: Once | INTRAVENOUS | Status: AC | PRN
  Administered 2024-02-07: 100 mL via INTRAVENOUS

## 2024-02-07 MED ORDER — IOHEXOL 300 MG/ML  SOLN
100.0000 mL | Freq: Once | INTRAMUSCULAR | Status: AC | PRN
  Administered 2024-02-07: 100 mL via INTRAVENOUS

## 2024-02-07 MED ORDER — KETOROLAC TROMETHAMINE 15 MG/ML IJ SOLN
15.0000 mg | Freq: Once | INTRAMUSCULAR | Status: DC
  Filled 2024-02-07: qty 1

## 2024-02-07 MED ORDER — METRONIDAZOLE 500 MG PO TABS: 500.0000 mg | ORAL_TABLET | Freq: Two times a day (BID) | ORAL | 0 refills | Status: AC

## 2024-02-07 NOTE — ED Provider Notes (Signed)
  Physical Exam  BP 103/67   Pulse 66   Temp 98 F (36.7 C)   Resp 18   Ht 5\' 7"  (1.702 m)   Wt 104.3 kg   LMP 01/30/2024 Comment: Tubal Ligation  SpO2 100%   BMI 36.01 kg/m   Physical Exam Vitals and nursing note reviewed.  Constitutional:      General: She is not in acute distress.    Appearance: She is not toxic-appearing.  HENT:     Head: Normocephalic and atraumatic.  Eyes:     General: No scleral icterus.    Conjunctiva/sclera: Conjunctivae normal.  Cardiovascular:     Rate and Rhythm: Normal rate and regular rhythm.     Pulses: Normal pulses.     Heart sounds: Normal heart sounds.  Pulmonary:     Effort: Pulmonary effort is normal. No respiratory distress.     Breath sounds: Normal breath sounds.  Abdominal:     General: Abdomen is flat. Bowel sounds are normal.     Palpations: Abdomen is soft.     Tenderness: There is no abdominal tenderness.  Genitourinary:    Comments: No cervical motion tenderness, does have small amount of vaginal bleeding - patient reports she is on her period. No abnormal discharge.  Skin:    General: Skin is warm and dry.     Findings: No lesion.  Neurological:     General: No focal deficit present.     Mental Status: She is alert and oriented to person, place, and time. Mental status is at baseline.     Procedures  Procedures  ED Course / MDM    Medical Decision Making Amount and/or Complexity of Data Reviewed Labs: ordered. Radiology: ordered.  Risk Prescription drug management.   Patient presenting with right lower quadrant abdominal pain that has been worsening for the past week or so.  Patient reports her pain started around the time she started her period. LMP 2/15 reports she has had persistently long heavy painful periods for a while now.  Upon receiving patient dispo was pending vaginal ultrasound results.  Patient's vaginal ultrasound shows area concerning for fibroid.  Also bilateral adnexal tubular structures  consistent with hydrosalpinx.   CT of abdomen does recommend correlating clinically for PID and cervical motion tenderness.  I did vaginal exam chaperoned.  Patient does not have any cervical motion tenderness.  She does have moderate amount of vaginal bleeding consistent with being on menstrual cycle.  Wet prep does show clue cells present thus I will treat for bacterial vaginosis.  During pelvic exam also obtained gonorrhea and chlamydia swabs.  Patient does not express concern over sexually transmitted disease being because of discomfort.  Patient reports she feels comfortable going home given results.  She will follow-up with the women's health specialist.  Will start metronidazole for bacterial vaginosis.  She is stable for discharge       Smitty Knudsen, PA-C 02/07/24 2333    Royanne Foots, DO 02/08/24 1724

## 2024-02-07 NOTE — ED Notes (Signed)
 Patient transported to Ultrasound

## 2024-02-07 NOTE — ED Notes (Signed)
 RN reviewed discharge instructions with pt. Pt verbalized understanding and had no further questions. VSS upon discharge.

## 2024-02-07 NOTE — ED Triage Notes (Signed)
 Arrives POV with complaints of worsening abdomina; pain going into her right leg. Patient states that she had her appendix removed last month and noted pain increasing over the past few days.

## 2024-02-07 NOTE — ED Provider Notes (Signed)
 [place] EMERGENCY DEPARTMENT AT [facility] Provider Note   CSN: [id number] Arrival date & time: 02/07/24  1436     History {Add pertinent medical, surgical, social history, OB history to HPI:1} Chief Complaint  Patient presents with   Abdominal Pain   Leg Pain    right    Jill Hutchinson is a 48 y.o. female.   Abdominal Pain Leg Pain   48 year old female presents emergency department with complaints of right sided abdominal pain.  Patient with symptoms present for the past week or so.  States that she does not remember exactly what she was doing when symptoms began.  Does report some pain radiating from the right back to her right lower abdomen as well as down the right front of her thigh.  States that the pain is worsened with certain movements.  Does report some associated nausea with no emesis.  Denies any fever, chills, urinary symptoms, change in bowel habits.  Does report history of appendectomy about a month and a half ago and is concerned about potential complications.  Has taken Tylenol which has helped some with the pain.  Past medical history significant for diabetes mellitus with appendectomy on 12/22/2023  Home Medications Prior to Admission medications   Medication Sig Start Date End Date Taking? Authorizing Provider  cholecalciferol (VITAMIN D3) 25 MCG (1000 UNIT) tablet Take 1,000 Units by mouth daily.    [provider]  lisdexamfetamine (VYVANSE) 50 MG capsule Take 50 mg by mouth daily. 12/21/23   [provider]  magnesium 30 MG tablet Take 30 mg by mouth 2 (two) times daily.    [provider]  Multiple Vitamins-Minerals (MULTIVITAMIN WITH MINERALS) tablet Take 1 tablet by mouth daily.    [provider]  oxyCODONE (ROXICODONE) 5 MG immediate release tablet Take 1 tablet (5 mg total) by mouth every 6 (six) hours as needed for severe pain. 05/09/22   Griselda Miner, MD      Allergies    Patient has no known  allergies.    Review of Systems   Review of Systems  Gastrointestinal:  Positive for abdominal pain.  All other systems reviewed and are negative.   Physical Exam Updated Vital Signs BP (!) 122/92 (BP Location: Right Arm)   Pulse 69   Temp 98.1 F (36.7 C)   Resp 18   Ht 5\' 7"  (1.702 m)   Wt 104.3 kg   SpO2 100%   BMI 36.01 kg/m  Physical Exam Vitals and nursing note reviewed.  Constitutional:      General: She is not in acute distress.    Appearance: She is well-developed.  HENT:     Head: Normocephalic and atraumatic.  Eyes:     Conjunctiva/sclera: Conjunctivae normal.  Cardiovascular:     Rate and Rhythm: Normal rate and regular rhythm.     Heart sounds: No murmur heard. Pulmonary:     Effort: Pulmonary effort is normal. No respiratory distress.     Breath sounds: Normal breath sounds.  Abdominal:     Palpations: Abdomen is soft.     Tenderness: There is abdominal tenderness in the right lower quadrant. There is right CVA tenderness.  Musculoskeletal:        General: No swelling.     Cervical back: Neck supple.  Skin:    General: Skin is warm and dry.     Capillary Refill: Capillary refill takes less than 2 seconds.  Neurological:     Mental  Status: She is alert.  Psychiatric:        Mood and Affect: Mood normal.     ED Results / Procedures / Treatments   Labs (all labs ordered are listed, but only abnormal results are displayed) Labs Reviewed  CBC - Abnormal; Notable for the following components:      Result Value   WBC 12.5 (*)    MCV 79.9 (*)    MCH 25.3 (*)    RDW 15.6 (*)    Platelets 457 (*)    All other components within normal limits  URINALYSIS, ROUTINE W REFLEX MICROSCOPIC - Abnormal; Notable for the following components:   Hgb urine dipstick MODERATE (*)    Leukocytes,Ua TRACE (*)    Bacteria, UA RARE (*)    All other components within normal limits  LIPASE, BLOOD  COMPREHENSIVE METABOLIC PANEL  PREGNANCY, URINE     EKG None  Radiology No results found.  Procedures Procedures  {Document cardiac monitor, telemetry assessment procedure when appropriate:1}  Medications Ordered in ED Medications - No data to display  ED Course/ Medical Decision Making/ A&P   {   Click here for ABCD2, HEART and other calculatorsREFRESH Note before signing :1}                              Medical Decision Making Amount and/or Complexity of Data Reviewed Labs: ordered. Radiology: ordered.  Risk Prescription drug management.   This patient presents to the ED for concern of right-sided abdominal pain, this involves an extensive number of treatment options, and is a complaint that carries with it a high risk of complications and morbidity.  The differential diagnosis includes pyelonephritis, nephrolithiasis, ectopic pregnancy, ovarian torsion, abscess, disc herniation, other   Co morbidities that complicate the patient evaluation  See HPI   Additional history obtained:  Additional history obtained from EMR External records from outside source obtained and reviewed including hospital records   Lab Tests:  I Ordered, and personally interpreted labs.  The pertinent results include: Leukocytosis of 12.5.  No evidence of anemia.  Thrombocytosis of 457.  UA with 3 bacteria, trace leukocytes, 6-10 WBCs but otherwise unremarkable.  Urine pregnancy negative.  No Electra abnormalities.  No transaminitis.  No renal dysfunction.  Lipase within normal limits.   Imaging Studies ordered:  I ordered imaging studies including CT abdomen pelvis, CT L-spine I independently visualized and interpreted imaging which showed  CT abdomen pelvis: No postoperative complications of appendectomy.  Bilateral adnexal tubular structures with right greater than left.  Hyperattenuating endometrium.  Mobile cecum now in right upper quadrant evidence of obstruction. CT L-spine: No acute fracture or traumatic malalignment.  Lower  lumbar facet arthropathy, most presents L4-L5. Pelvic ultrasound:*** I agree with the radiologist interpretation  Cardiac Monitoring: / EKG:  The patient was maintained on a cardiac monitor.  I personally viewed and interpreted the cardiac monitored which showed an underlying rhythm of: Sinus   Consultations Obtained:  N/a   Problem List / ED Course / Critical interventions / Medication management  *** I ordered medication including ***  for ***  Reevaluation of the patient after these medicines showed that the patient {resolved/improved/worsened:23923::"improved"} I have reviewed the patients home medicines and have made adjustments as needed   Social Determinants of Health:  Denies tobacco, illicit drug use.   Test / Admission - Considered:  *** Vitals signs within normal range and stable throughout visit. Laboratory/imaging studies  significant for: See above *** Worrisome signs and symptoms were discussed with the patient, and the patient acknowledged understanding to return to the ED if noticed. Patient was stable upon discharge.    {Document critical care time when appropriate:1} {Document review of labs and clinical decision tools ie heart score, Chads2Vasc2 etc:1}  {Document your independent review of radiology images, and any outside records:1} {Document your discussion with family members, caretakers, and with consultants:1} {Document social determinants of health affecting pt's care:1} {Document your decision making why or why not admission, treatments were needed:1} Final Clinical Impression(s) / ED Diagnoses Final diagnoses:  None    Rx / DC Orders ED Discharge Orders     None

## 2024-02-07 NOTE — Discharge Instructions (Addendum)
 You were seen in emergency room for abdominal pain.  I recommend taking ibuprofen or naproxen as well as Tylenol.  Given your heavy periods and findings consistent with fibroid and ultrasound please follow-up with OB.  Please call tomorrow to schedule an appointment for follow-up.  While you are taking the antibiotic you cannot drink alcohol.

## 2024-02-09 LAB — GC/CHLAMYDIA PROBE AMP (~~LOC~~) NOT AT ARMC
Chlamydia: NEGATIVE
Comment: NEGATIVE
Comment: NORMAL
Neisseria Gonorrhea: NEGATIVE
